# Patient Record
Sex: Female | Born: 1975 | Race: White | Hispanic: No | Marital: Married | Smoking: Former smoker
Health system: Southern US, Community
[De-identification: ages and names within clinical notes are randomized; demographics above are authoritative.]

## PROBLEM LIST (undated history)

## (undated) DIAGNOSIS — K219 Gastro-esophageal reflux disease without esophagitis: Secondary | ICD-10-CM

## (undated) DIAGNOSIS — J302 Other seasonal allergic rhinitis: Secondary | ICD-10-CM

## (undated) DIAGNOSIS — J45909 Unspecified asthma, uncomplicated: Secondary | ICD-10-CM

## (undated) HISTORY — DX: Unspecified asthma, uncomplicated: J45.909

## (undated) HISTORY — PX: DIAGNOSTIC LAPAROSCOPY: SUR761

## (undated) HISTORY — DX: Other seasonal allergic rhinitis: J30.2

---

## 2013-11-09 DIAGNOSIS — J45909 Unspecified asthma, uncomplicated: Secondary | ICD-10-CM | POA: Insufficient documentation

## 2013-11-09 DIAGNOSIS — E781 Pure hyperglyceridemia: Secondary | ICD-10-CM | POA: Insufficient documentation

## 2016-03-22 HISTORY — PX: OTHER SURGICAL HISTORY: SHX169

## 2016-09-06 ENCOUNTER — Ambulatory Visit (INDEPENDENT_AMBULATORY_CARE_PROVIDER_SITE_OTHER): Payer: BLUE CROSS/BLUE SHIELD | Admitting: Obstetrics & Gynecology

## 2016-09-06 ENCOUNTER — Encounter: Payer: Self-pay | Admitting: Obstetrics & Gynecology

## 2016-09-06 DIAGNOSIS — N920 Excessive and frequent menstruation with regular cycle: Secondary | ICD-10-CM

## 2016-09-06 NOTE — Progress Notes (Signed)
Subjective:     Patient ID: Anne Dixon, female   DOB: 04/22/1976, 40 y.o.   MRN: [id number]  HPI Pt presents for 2nd opinion.  Pt has had long Menstruation. Patient has a long-standing history of periods that last 10-14 days. She's never been able to be controlled on birth control pills. Patient had a E sure placed but had to be removed laparoscopically due to pelvic pain. 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 After the coils had been removed (salpingectomy) patient still experiences 10-14 day menstrual cycles. Patient was asked to take oral contraceptives for 2 more months prior to ablation. Patient took oral contraceptives for 1 month and did not have cycle control. [facility]. Patient never had endometrial biopsy. During the sure there was hysteroscopy and there was no polyp or fibroid noted. Of note, the patient used to do very well with Depo-Provera and had amenorrhea. Patient is not opposed to a Mirena IUD.  Review of Systems  Constitutional: Negative.   Cardiovascular: Negative.   Gastrointestinal: Negative.   Endocrine: Negative.   Genitourinary: Positive for menstrual problem.  Psychiatric/Behavioral: Negative.        Objective:   Physical Exam No exam performed. This was a consult for management.    Assessment:     40 year old female with long menstrual cycles that occur regularly.    Plan:   1-Endometrial biopsy suggested 2-Depo-Provera or Mirena as least invasive step 3-endometrial ablation if Depo-Provera or Mirena pale 4-hysterectomy as last resort 5-patient is going to think about her options. She may go back to [facility] or she may return here.  30 minutes spent face-to-face with patient with greater than 50% counseling

## 2016-09-19 ENCOUNTER — Ambulatory Visit (INDEPENDENT_AMBULATORY_CARE_PROVIDER_SITE_OTHER): Payer: BLUE CROSS/BLUE SHIELD | Admitting: Obstetrics & Gynecology

## 2016-09-19 ENCOUNTER — Encounter: Payer: Self-pay | Admitting: Obstetrics & Gynecology

## 2016-09-19 VITALS — BP 109/66 | HR 80 | Resp 16 | Ht 62.0 in | Wt 192.0 lb

## 2016-09-19 DIAGNOSIS — N939 Abnormal uterine and vaginal bleeding, unspecified: Secondary | ICD-10-CM

## 2016-09-19 DIAGNOSIS — Z3202 Encounter for pregnancy test, result negative: Secondary | ICD-10-CM

## 2016-09-19 LAB — POCT URINE PREGNANCY: Preg Test, Ur: NEGATIVE

## 2016-09-19 NOTE — Progress Notes (Signed)
ENDOMETRIAL BIOPSY     The indications for endometrial biopsy were reviewed.   Risks of the biopsy including cramping, bleeding, infection, uterine perforation, inadequate specimen and need for additional procedures  were discussed. The patient states she understands and agrees to undergo procedure today. Consent was signed. Time out was performed. Urine HCG was negative. A sterile speculum was placed in the patient's vagina and the cervix was prepped with Betadine. A single-toothed tenaculum was placed on the anterior lip of the cervix to stabilize it. The 3 mm pipelle was introduced into the endometrial cavity without difficulty to a depth of 7.5 cm, and a moderate amount of tissue was obtained and sent to pathology. The instruments were removed from the patient's vagina. Minimal bleeding from the cervix was noted. The patient tolerated the procedure well. Routine post-procedure instructions were given to the patient. The patient will follow up to review the results and for further management.    Pt wants a Mirena and will come back in for that in 1-3 weeks.

## 2016-09-20 ENCOUNTER — Encounter: Payer: Self-pay | Admitting: *Deleted

## 2016-09-27 ENCOUNTER — Encounter: Payer: Self-pay | Admitting: Obstetrics & Gynecology

## 2016-10-02 ENCOUNTER — Telehealth: Payer: Self-pay | Admitting: *Deleted

## 2016-10-02 NOTE — Telephone Encounter (Signed)
Pt notified that Dr Penne LashLeggett is waiting for additional information from pathology about her polyp before procedding with IUD insertion

## 2016-10-03 ENCOUNTER — Telehealth: Payer: Self-pay | Admitting: Obstetrics & Gynecology

## 2016-10-03 NOTE — Telephone Encounter (Signed)
Patient to discuss biopsy results. I did discuss the case with Dr. Sherald HessJoshua Cash pathologist. He said the polyps with miniscule and I most likely guided in the Pipelle. I do not have the patient's records from her prior doctor. She will bring them by November 2 2R [place] office. I would like to see how thick her endometrial lining is. We discussed options of seeing if bleeding improved after polyp removal. IUD. Hysteroscopy and ablation. An saline sono hysterogram. Once I review the ultrasound we will make a final decision together.

## 2016-10-22 ENCOUNTER — Telehealth: Payer: Self-pay

## 2016-10-22 NOTE — Telephone Encounter (Signed)
Left message for pt to call office to schedule appt. Pt left a VM on office answering machine to get scheduled.

## 2016-10-30 ENCOUNTER — Encounter: Payer: Self-pay | Admitting: Obstetrics & Gynecology

## 2016-11-15 ENCOUNTER — Encounter: Payer: Self-pay | Admitting: Obstetrics & Gynecology

## 2016-11-15 ENCOUNTER — Ambulatory Visit (INDEPENDENT_AMBULATORY_CARE_PROVIDER_SITE_OTHER): Payer: BLUE CROSS/BLUE SHIELD | Admitting: Obstetrics & Gynecology

## 2016-11-15 VITALS — BP 123/76 | HR 77 | Ht 63.0 in | Wt 193.0 lb

## 2016-11-15 DIAGNOSIS — Z0000 Encounter for general adult medical examination without abnormal findings: Secondary | ICD-10-CM | POA: Diagnosis not present

## 2016-11-15 DIAGNOSIS — Z01419 Encounter for gynecological examination (general) (routine) without abnormal findings: Secondary | ICD-10-CM

## 2016-11-15 NOTE — Progress Notes (Signed)
Subjective:     Anne GimenezShannon Dixon is a 40 y.o. female here for a routine exam.  Current complaints: none.  Last menses only lasted 7 days with nml flow.  Pt good with no intervention at this time.  If long menses returns, will consider IUD or ablation..   Gynecologic History Patient's last menstrual period was 11/08/2016. Contraception: salpingectomy Last Pap: 2016. Results were: normal Last mammogram: 2017. Results were: normal per patient  Obstetric History OB History  Gravida Para Term Preterm AB Living  2 2 2     2   SAB TAB Ectopic Multiple Live Births               # Outcome Date GA Lbr Len/2nd Weight Sex Delivery Anes PTL Lv  2 Term           1 Term                The following portions of the patient's history were reviewed and updated as appropriate: allergies, current medications, past family history, past medical history, past social history, past surgical history and problem list.  Review of Systems Pertinent items noted in HPI and remainder of comprehensive ROS otherwise negative.    Objective:      Vitals:   11/15/16 1320  BP: 123/76  Pulse: 77  Weight: 193 lb (87.5 kg)  Height: 5\' 3"  (1.6 m)   Vitals:  WNL General appearance: alert, cooperative and no distress  HEENT: Normocephalic, without obvious abnormality, atraumatic Eyes: negative Throat: lips, mucosa, and tongue normal; teeth and gums normal  Respiratory: Clear to auscultation bilaterally  CV: Regular rate and rhythm  Breasts:  Normal appearance, no masses or tenderness, no nipple retraction or dimpling  GI: Soft, non-tender; bowel sounds normal; no masses,  no organomegaly  GU: External Genitalia:  Tanner V, no lesion Urethra:  No prolapse   Vagina: Pink, normal rugae, no blood or discharge  Cervix: No CMT, no lesion  Uterus:  Normal size and contour, non tender  Adnexa: Normal, no masses, non tender  Musculoskeletal: No edema, redness or tenderness in the calves or thighs  Skin: No lesions or  rash  Lymphatic: Axillary adenopathy: none     Psychiatric: Normal mood and behavior   Vitals:   11/15/16 1320  BP: 123/76  Pulse: 77  Weight: 193 lb (87.5 kg)  Height: 5\' 3"  (1.6 m)      Assessment:    Healthy female exam.    Plan:    Education reviewed: self breast exams and weight bearing exercise. Contraception: salpingectomy. Follow up in: 1 year. Yearly mammogram  Pt needs pap in 2 years (had nml lat year with another practice)

## 2016-11-15 NOTE — Patient Instructions (Signed)
 Preventive Care 18-39 Years, Female Preventive care refers to lifestyle choices and visits with your health care provider that can promote health and wellness. What does preventive care include?  A yearly physical exam. This is also called an annual well check.  Dental exams once or twice a year.  Routine eye exams. Ask your health care provider how often you should have your eyes checked.  Personal lifestyle choices, including:  Daily care of your teeth and gums.  Regular physical activity.  Eating a healthy diet.  Avoiding tobacco and drug use.  Limiting alcohol use.  Practicing safe sex.  Taking vitamin and mineral supplements as recommended by your health care provider. What happens during an annual well check? The services and screenings done by your health care provider during your annual well check will depend on your age, overall health, lifestyle risk factors, and family history of disease. Counseling  Your health care provider may ask you questions about your:  Alcohol use.  Tobacco use.  Drug use.  Emotional well-being.  Home and relationship well-being.  Sexual activity.  Eating habits.  Work and work environment.  Method of birth control.  Menstrual cycle.  Pregnancy history. Screening  You may have the following tests or measurements:  Height, weight, and BMI.  Diabetes screening. This is done by checking your blood sugar (glucose) after you have not eaten for a while (fasting).  Blood pressure.  Lipid and cholesterol levels. These may be checked every 5 years starting at age 20.  Skin check.  Hepatitis C blood test.  Hepatitis B blood test.  Sexually transmitted disease (STD) testing.  BRCA-related cancer screening. This may be done if you have a family history of breast, ovarian, tubal, or peritoneal cancers.  Pelvic exam and Pap test. This may be done every 3 years starting at age 21. Starting at age 30, this may be done  every 5 years if you have a Pap test in combination with an HPV test. Discuss your test results, treatment options, and if necessary, the need for more tests with your health care provider. Vaccines  Your health care provider may recommend certain vaccines, such as:  Influenza vaccine. This is recommended every year.  Tetanus, diphtheria, and acellular pertussis (Tdap, Td) vaccine. You may need a Td booster every 10 years.  Varicella vaccine. You may need this if you have not been vaccinated.  HPV vaccine. If you are 26 or younger, you may need three doses over 6 months.  Measles, mumps, and rubella (MMR) vaccine. You may need at least one dose of MMR. You may also need a second dose.  Pneumococcal 13-valent conjugate (PCV13) vaccine. You may need this if you have certain conditions and were not previously vaccinated.  Pneumococcal polysaccharide (PPSV23) vaccine. You may need one or two doses if you smoke cigarettes or if you have certain conditions.  Meningococcal vaccine. One dose is recommended if you are age 19-21 years and a first-year college student living in a residence hall, or if you have one of several medical conditions. You may also need additional booster doses.  Hepatitis A vaccine. You may need this if you have certain conditions or if you travel or work in places where you may be exposed to hepatitis A.  Hepatitis B vaccine. You may need this if you have certain conditions or if you travel or work in places where you may be exposed to hepatitis B.  Haemophilus influenzae type b (Hib) vaccine. You may need   this if you have certain risk factors. Talk to your health care provider about which screenings and vaccines you need and how often you need them. This information is not intended to replace advice given to you by your health care provider. Make sure you discuss any questions you have with your health care provider. Document Released: 01/15/2002 Document Revised:  08/08/2016 Document Reviewed: 09/20/2015 Elsevier Interactive Patient Education  2017 Elsevier Inc.  

## 2016-11-27 ENCOUNTER — Other Ambulatory Visit: Payer: Self-pay | Admitting: *Deleted

## 2016-11-27 ENCOUNTER — Encounter: Payer: Self-pay | Admitting: Obstetrics & Gynecology

## 2016-11-27 ENCOUNTER — Telehealth: Payer: Self-pay | Admitting: *Deleted

## 2016-11-27 DIAGNOSIS — Z30430 Encounter for insertion of intrauterine contraceptive device: Secondary | ICD-10-CM

## 2016-11-27 MED ORDER — MISOPROSTOL 200 MCG PO TABS: ORAL_TABLET | ORAL | 0 refills | Status: DC

## 2016-11-27 NOTE — Telephone Encounter (Signed)
[facility] does not carry Cytotec so [facility] [place]

## 2016-11-27 NOTE — Telephone Encounter (Signed)
Pt called requesting an appt for IUD insert.  [facility] to [facility] into vagina night prior to procedure.

## 2016-12-04 ENCOUNTER — Encounter: Payer: Self-pay | Admitting: Obstetrics & Gynecology

## 2016-12-04 ENCOUNTER — Ambulatory Visit (INDEPENDENT_AMBULATORY_CARE_PROVIDER_SITE_OTHER): Payer: BLUE CROSS/BLUE SHIELD | Admitting: Obstetrics & Gynecology

## 2016-12-04 ENCOUNTER — Encounter: Payer: Self-pay | Admitting: *Deleted

## 2016-12-04 VITALS — BP 107/70 | HR 72 | Resp 16 | Ht 63.0 in | Wt 193.0 lb

## 2016-12-04 DIAGNOSIS — Z113 Encounter for screening for infections with a predominantly sexual mode of transmission: Secondary | ICD-10-CM | POA: Diagnosis not present

## 2016-12-04 DIAGNOSIS — Z3202 Encounter for pregnancy test, result negative: Secondary | ICD-10-CM

## 2016-12-04 DIAGNOSIS — Z30430 Encounter for insertion of intrauterine contraceptive device: Secondary | ICD-10-CM | POA: Diagnosis not present

## 2016-12-04 LAB — POCT URINE PREGNANCY: Preg Test, Ur: NEGATIVE

## 2016-12-04 MED ORDER — LEVONORGESTREL 20 MCG/24HR IU IUD
INTRAUTERINE_SYSTEM | Freq: Once | INTRAUTERINE | Status: AC
  Administered 2016-12-04: 15:00:00 via INTRAUTERINE

## 2016-12-05 NOTE — Progress Notes (Signed)
IUD Procedure Note Patient identified, informed consent performed.  Discussed risks of irregular bleeding, cramping, infection, malpositioning or misplacement of the IUD outside the uterus which may require further procedures. Time out was performed.  Urine pregnancy test negative.  Speculum placed in the vagina.  Cervix visualized.  Cleaned with Betadine x 2.  Grasped anteriorly with a single tooth tenaculum.  Uterus sounded to 7.5 cm.  Mirena IUD placed per manufacturer's recommendations.  Strings trimmed to 3 cm. Tenaculum was removed, good hemostasis noted.  Patient tolerated procedure well.   Patient was given post-procedure instructions and the Mirena care card with expiration date.  Patient was also asked to check IUD strings periodically and follow up in 4-6 weeks for IUD check.

## 2016-12-06 LAB — CERVICOVAGINAL ANCILLARY ONLY
Chlamydia: NEGATIVE
NEISSERIA GONORRHEA: NEGATIVE

## 2016-12-18 ENCOUNTER — Encounter: Payer: Self-pay | Admitting: Obstetrics & Gynecology

## 2017-01-09 ENCOUNTER — Encounter: Payer: Self-pay | Admitting: *Deleted

## 2017-01-09 ENCOUNTER — Encounter (HOSPITAL_COMMUNITY): Payer: Self-pay

## 2017-01-16 ENCOUNTER — Ambulatory Visit: Payer: BLUE CROSS/BLUE SHIELD | Admitting: Obstetrics & Gynecology

## 2017-01-17 ENCOUNTER — Encounter (HOSPITAL_COMMUNITY): Payer: Self-pay

## 2017-01-24 ENCOUNTER — Ambulatory Visit (HOSPITAL_COMMUNITY): Payer: BLUE CROSS/BLUE SHIELD | Admitting: Anesthesiology

## 2017-01-24 ENCOUNTER — Ambulatory Visit (HOSPITAL_COMMUNITY)
Admission: RE | Admit: 2017-01-24 | Discharge: 2017-01-24 | Disposition: A | Discharge status: Home / Self Care | Payer: BLUE CROSS/BLUE SHIELD | Source: Ambulatory Visit | Attending: Obstetrics & Gynecology | Admitting: Obstetrics & Gynecology

## 2017-01-24 ENCOUNTER — Encounter (HOSPITAL_COMMUNITY)
Admission: RE | Disposition: A | Discharge status: Home / Self Care | Payer: Self-pay | Source: Ambulatory Visit | Attending: Obstetrics & Gynecology

## 2017-01-24 ENCOUNTER — Encounter (HOSPITAL_COMMUNITY): Payer: Self-pay | Admitting: Anesthesiology

## 2017-01-24 DIAGNOSIS — J45909 Unspecified asthma, uncomplicated: Secondary | ICD-10-CM | POA: Diagnosis not present

## 2017-01-24 DIAGNOSIS — Z30432 Encounter for removal of intrauterine contraceptive device: Secondary | ICD-10-CM | POA: Diagnosis not present

## 2017-01-24 DIAGNOSIS — N921 Excessive and frequent menstruation with irregular cycle: Secondary | ICD-10-CM | POA: Insufficient documentation

## 2017-01-24 DIAGNOSIS — N840 Polyp of corpus uteri: Secondary | ICD-10-CM | POA: Insufficient documentation

## 2017-01-24 DIAGNOSIS — Z882 Allergy status to sulfonamides status: Secondary | ICD-10-CM | POA: Diagnosis not present

## 2017-01-24 HISTORY — DX: Gastro-esophageal reflux disease without esophagitis: K21.9

## 2017-01-24 HISTORY — PX: HYSTEROSCOPY: SHX211

## 2017-01-24 LAB — PREGNANCY, URINE: Preg Test, Ur: NEGATIVE

## 2017-01-24 LAB — CBC
HCT: 39.8 % (ref 36.0–46.0)
HEMOGLOBIN: 13.7 g/dL (ref 12.0–15.0)
MCH: 30.1 pg (ref 26.0–34.0)
MCHC: 34.4 g/dL (ref 30.0–36.0)
MCV: 87.5 fL (ref 78.0–100.0)
Platelets: 250 10*3/uL (ref 150–400)
RBC: 4.55 MIL/uL (ref 3.87–5.11)
RDW: 13.1 % (ref 11.5–15.5)
WBC: 10.5 10*3/uL (ref 4.0–10.5)

## 2017-01-24 LAB — TYPE AND SCREEN
ABO/RH(D): AB POS
Antibody Screen: NEGATIVE

## 2017-01-24 LAB — ABO/RH: ABO/RH(D): AB POS

## 2017-01-24 SURGERY — ABLATION, ENDOMETRIUM, HYSTEROSCOPIC
Anesthesia: General | Site: Abdomen

## 2017-01-24 MED ORDER — KETOROLAC TROMETHAMINE 30 MG/ML IJ SOLN
INTRAMUSCULAR | Status: AC
  Filled 2017-01-24: qty 1

## 2017-01-24 MED ORDER — PROPOFOL 10 MG/ML IV BOLUS
INTRAVENOUS | Status: AC
  Filled 2017-01-24: qty 20

## 2017-01-24 MED ORDER — SCOPOLAMINE 1 MG/3DAYS TD PT72
1.0000 | MEDICATED_PATCH | Freq: Once | TRANSDERMAL | Status: DC
  Administered 2017-01-24: 1.5 mg via TRANSDERMAL

## 2017-01-24 MED ORDER — FENTANYL CITRATE (PF) 100 MCG/2ML IJ SOLN
25.0000 ug | INTRAMUSCULAR | Status: DC | PRN
  Administered 2017-01-24: 50 ug via INTRAVENOUS
  Administered 2017-01-24 (×2): 25 ug via INTRAVENOUS

## 2017-01-24 MED ORDER — MIDAZOLAM HCL 2 MG/2ML IJ SOLN
INTRAMUSCULAR | Status: AC
  Filled 2017-01-24: qty 2

## 2017-01-24 MED ORDER — LACTATED RINGERS IV SOLN
INTRAVENOUS | Status: DC
  Administered 2017-01-24: 08:00:00 via INTRAVENOUS

## 2017-01-24 MED ORDER — KETOROLAC TROMETHAMINE 30 MG/ML IJ SOLN
INTRAMUSCULAR | Status: DC | PRN
  Administered 2017-01-24: 30 mg via INTRAVENOUS

## 2017-01-24 MED ORDER — FENTANYL CITRATE (PF) 100 MCG/2ML IJ SOLN
INTRAMUSCULAR | Status: DC | PRN
  Administered 2017-01-24 (×2): 50 ug via INTRAVENOUS

## 2017-01-24 MED ORDER — DEXAMETHASONE SODIUM PHOSPHATE 10 MG/ML IJ SOLN
INTRAMUSCULAR | Status: DC | PRN
  Administered 2017-01-24: 4 mg via INTRAVENOUS

## 2017-01-24 MED ORDER — METOCLOPRAMIDE HCL 5 MG/ML IJ SOLN: 10.0000 mg | Freq: Once | INTRAMUSCULAR | Status: DC | PRN

## 2017-01-24 MED ORDER — ONDANSETRON HCL 4 MG/2ML IJ SOLN
INTRAMUSCULAR | Status: DC | PRN
  Administered 2017-01-24: 4 mg via INTRAVENOUS

## 2017-01-24 MED ORDER — ONDANSETRON HCL 4 MG/2ML IJ SOLN
INTRAMUSCULAR | Status: AC
  Filled 2017-01-24: qty 2

## 2017-01-24 MED ORDER — MEPERIDINE HCL 25 MG/ML IJ SOLN: 6.2500 mg | INTRAMUSCULAR | Status: DC | PRN

## 2017-01-24 MED ORDER — SODIUM CHLORIDE 0.9 % IR SOLN
Status: DC | PRN
  Administered 2017-01-24: 3000 mL

## 2017-01-24 MED ORDER — HYDROCODONE-ACETAMINOPHEN 5-325 MG PO TABS: ORAL_TABLET | ORAL | 0 refills | Status: DC

## 2017-01-24 MED ORDER — LACTATED RINGERS IV SOLN
INTRAVENOUS | Status: DC | PRN
  Administered 2017-01-24: 08:00:00 via INTRAVENOUS

## 2017-01-24 MED ORDER — IBUPROFEN 600 MG PO TABS: 600.0000 mg | ORAL_TABLET | Freq: Four times a day (QID) | ORAL | 1 refills | Status: DC | PRN

## 2017-01-24 MED ORDER — PROPOFOL 10 MG/ML IV BOLUS
INTRAVENOUS | Status: DC | PRN
  Administered 2017-01-24: 180 mg via INTRAVENOUS

## 2017-01-24 MED ORDER — SODIUM CHLORIDE 0.9 % IV SOLN: INTRAVENOUS | Status: DC

## 2017-01-24 MED ORDER — LIDOCAINE HCL (CARDIAC) 20 MG/ML IV SOLN
INTRAVENOUS | Status: AC
  Filled 2017-01-24: qty 5

## 2017-01-24 MED ORDER — SCOPOLAMINE 1 MG/3DAYS TD PT72
MEDICATED_PATCH | TRANSDERMAL | Status: AC
  Administered 2017-01-24: 1.5 mg via TRANSDERMAL
  Filled 2017-01-24: qty 1

## 2017-01-24 MED ORDER — FENTANYL CITRATE (PF) 100 MCG/2ML IJ SOLN
INTRAMUSCULAR | Status: AC
  Administered 2017-01-24: 50 ug via INTRAVENOUS
  Filled 2017-01-24: qty 2

## 2017-01-24 MED ORDER — FENTANYL CITRATE (PF) 100 MCG/2ML IJ SOLN
INTRAMUSCULAR | Status: AC
  Filled 2017-01-24: qty 2

## 2017-01-24 MED ORDER — DEXAMETHASONE SODIUM PHOSPHATE 4 MG/ML IJ SOLN
INTRAMUSCULAR | Status: AC
  Filled 2017-01-24: qty 1

## 2017-01-24 MED ORDER — LIDOCAINE HCL (CARDIAC) 20 MG/ML IV SOLN
INTRAVENOUS | Status: DC | PRN
  Administered 2017-01-24: 70 mg via INTRAVENOUS
  Administered 2017-01-24: 30 mg via INTRAVENOUS

## 2017-01-24 MED ORDER — MIDAZOLAM HCL 2 MG/2ML IJ SOLN
INTRAMUSCULAR | Status: DC | PRN
  Administered 2017-01-24: 1 mg via INTRAVENOUS

## 2017-01-24 MED ORDER — LACTATED RINGERS IV SOLN: INTRAVENOUS | Status: DC

## 2017-01-24 SURGICAL SUPPLY — 14 items
CATH ROBINSON RED A/P 16FR (CATHETERS) ×3 IMPLANT
CLOTH BEACON ORANGE TIMEOUT ST (SAFETY) ×3 IMPLANT
CONTAINER PREFILL 10% NBF 60ML (FORM) ×6 IMPLANT
ELECT REM PT RETURN 9FT ADLT (ELECTROSURGICAL)
ELECTRODE REM PT RTRN 9FT ADLT (ELECTROSURGICAL) IMPLANT
GLOVE BIO SURGEON STRL SZ7 (GLOVE) ×3 IMPLANT
GLOVE BIOGEL PI IND STRL 7.0 (GLOVE) ×2 IMPLANT
GLOVE BIOGEL PI INDICATOR 7.0 (GLOVE) ×4
GOWN STRL REUS W/TWL LRG LVL3 (GOWN DISPOSABLE) ×9 IMPLANT
NS IRRIG 1000ML POUR BTL (IV SOLUTION) ×3 IMPLANT
PACK VAGINAL MINOR WOMEN LF (CUSTOM PROCEDURE TRAY) ×3 IMPLANT
PAD OB MATERNITY 4.3X12.25 (PERSONAL CARE ITEMS) ×6 IMPLANT
SET GENESYS HTA PROCERVA (MISCELLANEOUS) ×3 IMPLANT
TOWEL OR 17X24 6PK STRL BLUE (TOWEL DISPOSABLE) ×6 IMPLANT

## 2017-01-24 NOTE — Discharge Instructions (Signed)
General Anesthesia, Adult, Care After °These instructions provide you with information about caring for yourself after your procedure. Your health care provider may also give you more specific instructions. Your treatment has been planned according to current medical practices, but problems sometimes occur. Call your health care provider if you have any problems or questions after your procedure. °What can I expect after the procedure? °After the procedure, it is common to have: °· Vomiting. °· A sore throat. °· Mental slowness. °It is common to feel: °· Nauseous. °· Cold or shivery. °· Sleepy. °· Tired. °· Sore or achy, even in parts of your body where you did not have surgery. °Follow these instructions at home: °For at least 24 hours after the procedure: °· Do not: °¨ Participate in activities where you could fall or become injured. °¨ Drive. °¨ Use heavy machinery. °¨ Drink alcohol. °¨ Take sleeping pills or medicines that cause drowsiness. °¨ Make important decisions or sign legal documents. °¨ Take care of children on your own. °· Rest. °Eating and drinking °· If you vomit, drink water, juice, or soup when you can drink without vomiting. °· Drink enough fluid to keep your urine clear or pale yellow. °· Make sure you have little or no nausea before eating solid foods. °· Follow the diet recommended by your health care provider. °General instructions °· Have a responsible adult stay with you until you are awake and alert. °· Return to your normal activities as told by your health care provider. Ask your health care provider what activities are safe for you. °· Take over-the-counter and prescription medicines only as told by your health care provider. °· If you smoke, do not smoke without supervision. °· Keep all follow-up visits as told by your health care provider. This is important. °Contact a health care provider if: °· You continue to have nausea or vomiting at home, and medicines are not helpful. °· You  cannot drink fluids or start eating again. °· You cannot urinate after 8-12 hours. °· You develop a skin rash. °· You have fever. °· You have increasing redness at the site of your procedure. °Get help right away if: °· You have difficulty breathing. °· You have chest pain. °· You have unexpected bleeding. °· You feel that you are having a life-threatening or urgent problem. °This information is not intended to replace advice given to you by your health care provider. Make sure you discuss any questions you have with your health care provider. °Document Released: 02/25/2001 Document Revised: 04/23/2016 Document Reviewed: 11/03/2015 °Elsevier Interactive Patient Education © 2017 Elsevier Inc. °Dilation and Curettage or Vacuum Curettage, Care After °These instructions give you information about caring for yourself after your procedure. Your doctor may also give you more specific instructions. Call your doctor if you have any problems or questions after your procedure. °Follow these instructions at home: °Activity °· Do not drive or use heavy machinery while taking prescription pain medicine. °· For 24 hours after your procedure, avoid driving. °· Take short walks often, followed by rest periods. Ask your doctor what activities are safe for you. After one or two days, you may be able to return to your normal activities. °· Do not lift anything that is heavier than 10 lb (4.5 kg) until your doctor approves. °· For at least 2 weeks, or as long as told by your doctor: °¨ Do not douche. °¨ Do not use tampons. °¨ Do not have sex. °General instructions °· Take over-the-counter and prescription medicines   only as told by your doctor. This is very important if you take blood thinning medicine. °· Do not take baths, swim, or use a hot tub until your doctor approves. Take showers instead of baths. °· Wear compression stockings as told by your doctor. °· It is up to you to get the results of your procedure. Ask your doctor when  your results will be ready. °· Keep all follow-up visits as told by your doctor. This is important. °Contact a doctor if: °· You have very bad cramps that get worse or do not get better with medicine. °· You have very bad pain in your belly (abdomen). °· You cannot drink fluids without throwing up (vomiting). °· You get pain in a different part of the area between your belly and thighs (pelvis). °· You have bad-smelling discharge from your vagina. °· You have a rash. °Get help right away if: °· You are bleeding a lot from your vagina. A lot of bleeding means soaking more than one sanitary pad in an hour, for 2 hours in a row. °· You have clumps of blood (blood clots) coming from your vagina. °· You have a fever or chills. °· Your belly feels very tender or hard. °· You have chest pain. °· You have trouble breathing. °· You cough up blood. °· You feel dizzy. °· You feel light-headed. °· You pass out (faint). °· You have pain in your neck or shoulder area. °Summary °· Take short walks often, followed by rest periods. Ask your doctor what activities are safe for you. After one or two days, you may be able to return to your normal activities. °· Do not lift anything that is heavier than 10 lb (4.5 kg) until your doctor approves. °· Do not take baths, swim, or use a hot tub until your doctor approves. Take showers instead of baths. °· Contact your doctor if you have any symptoms of infection, like bad-smelling discharge from your vagina. °This information is not intended to replace advice given to you by your health care provider. Make sure you discuss any questions you have with your health care provider. °Document Released: 08/28/2008 Document Revised: 08/06/2016 Document Reviewed: 08/06/2016 °Elsevier Interactive Patient Education © 2017 Elsevier Inc. ° °

## 2017-01-24 NOTE — Transfer of Care (Signed)
Immediate Anesthesia Transfer of Care Note  Patient: Anne GimenezShannon Folson  Procedure(s) Performed: Procedure(s): DILATATION AND CURRETAGE, HYDROTHERMAL ABLATION REMOVAL OF IUD (N/A)  Patient Location: PACU  Anesthesia Type:General  Level of Consciousness: awake, alert , oriented and patient cooperative  Airway & Oxygen Therapy: Patient Spontanous Breathing and Patient connected to nasal cannula oxygen  Post-op Assessment: Report given to RN and Post -op Vital signs reviewed and stable  Post vital signs: Reviewed and stable  Last Vitals:  Vitals:   01/24/17 0742  BP: 127/87  Pulse: 89  Resp: 16  Temp: 36.8 C    Last Pain:  Vitals:   01/24/17 0742  TempSrc: Oral      Patients Stated Pain Goal: 3 (01/24/17 0742)  Complications: No apparent anesthesia complications

## 2017-01-24 NOTE — Anesthesia Preprocedure Evaluation (Signed)
Anesthesia Evaluation  Patient identified by MRN, date of birth, ID band Patient awake    Reviewed: Allergy & Precautions, NPO status , Patient's Chart, lab work & pertinent test results  Airway Mallampati: II  TM Distance: >3 FB Neck ROM: Full    Dental no notable dental hx.    Pulmonary asthma ,    Pulmonary exam normal breath sounds clear to auscultation       Cardiovascular negative cardio ROS Normal cardiovascular exam Rhythm:Regular Rate:Normal     Neuro/Psych negative neurological ROS  negative psych ROS   GI/Hepatic negative GI ROS, Neg liver ROS, neg GERD  ,  Endo/Other  negative endocrine ROS  Renal/GU negative Renal ROS  negative genitourinary   Musculoskeletal negative musculoskeletal ROS (+)   Abdominal   Peds negative pediatric ROS (+)  Hematology negative hematology ROS (+)   Anesthesia Other Findings   Reproductive/Obstetrics negative OB ROS                             Anesthesia Physical Anesthesia Plan  ASA: II  Anesthesia Plan: General   Post-op Pain Management:    Induction: Intravenous  Airway Management Planned: LMA  Additional Equipment:   Intra-op Plan:   Post-operative Plan:   Informed Consent: I have reviewed the patients History and Physical, chart, labs and discussed the procedure including the risks, benefits and alternatives for the proposed anesthesia with the patient or authorized representative who has indicated his/her understanding and acceptance.   Dental advisory given  Plan Discussed with: CRNA  Anesthesia Plan Comments:         Anesthesia Quick Evaluation

## 2017-01-24 NOTE — Anesthesia Procedure Notes (Signed)
Procedure Name: Intubation Date/Time: 01/24/2017 8:49 AM Performed by: Suella GroveMOORE, Zvi Duplantis C Pre-anesthesia Checklist: Patient identified, Emergency Drugs available, Suction available and Patient being monitored Patient Re-evaluated:Patient Re-evaluated prior to inductionOxygen Delivery Method: Circle system utilized Preoxygenation: Pre-oxygenation with 100% oxygen Intubation Type: IV induction and Inhalational induction Ventilation: Mask ventilation without difficulty LMA: LMA inserted LMA Size: 4.0 Grade View: Grade II Number of attempts: 1 Placement Confirmation: positive ETCO2 and breath sounds checked- equal and bilateral Dental Injury: Teeth and Oropharynx as per pre-operative assessment

## 2017-01-24 NOTE — H&P (Signed)
Anne Dixon is an 41 y.o. female with menometrorrhagia that failed IUD.  Pertinent Gynecological History: Menses: irruegular and heavy Bleeding: intermenstrual bleeding Contraception: tubal ligation DES exposure: denies   Menstrual History:  Patient's last menstrual period was 01/24/2017.    Past Medical History:  Diagnosis Date  . Asthma   . GERD (gastroesophageal reflux disease)    with pregnancy  . Seasonal allergies     Past Surgical History:  Procedure Laterality Date  . CESAREAN SECTION    . DIAGNOSTIC LAPAROSCOPY    . Fallopian Tube Removal  03/22/2016    Family History  Problem Relation Age of Onset  . Diabetes Paternal Grandfather   . Diabetes Paternal Grandmother   . Diabetes Maternal Grandmother   . Diabetes Maternal Grandfather   . Diabetes Father   . Cervical cancer Sister     Social History:  reports that she has never smoked. She has never used smokeless tobacco. She reports that she drinks alcohol. She reports that she uses drugs, including Marijuana.  Allergies:  Allergies  Allergen Reactions  . Sulfa Antibiotics Rash    Maculopapular eruption on arms and upper chest 04/25/2015    Prescriptions Prior to Admission  Medication Sig Dispense Refill Last Dose  . cetirizine (ZYRTEC) 10 MG tablet Take 10 mg by mouth at bedtime.    01/23/2017 at Unknown time  . ibuprofen (ADVIL,MOTRIN) 200 MG tablet Take 400-600 mg by mouth every 6 (six) hours as needed for headache or moderate pain.   01/23/2017 at Unknown time  . montelukast (SINGULAIR) 10 MG tablet Take 10 mg by mouth at bedtime.    01/23/2017 at Unknown time  . Multiple Vitamin (MULTIVITAMIN WITH MINERALS) TABS tablet Take 0.5 tablets by mouth daily.   01/23/2017 at Unknown time  . pseudoephedrine (SUDAFED) 30 MG tablet Take 30 mg by mouth every 4 (four) hours as needed for congestion.   More than a month at Unknown time  . valACYclovir (VALTREX) 1000 MG tablet Take 1,000 mg by mouth 2 (two) times daily  as needed (fever blisters).    More than a month at Unknown time    Review of Systems  Constitutional: Negative.   Respiratory: Negative.   Cardiovascular: Negative.   Gastrointestinal: Negative.   Skin: Negative.   Neurological: Negative.   Psychiatric/Behavioral: Negative.     Blood pressure 127/87, pulse 89, temperature 98.3 F (36.8 C), temperature source Oral, resp. rate 16, height 5\' 2"  (1.575 m), weight 189 lb (85.7 kg), last menstrual period 01/24/2017, SpO2 100 %. Physical Exam  Vitals reviewed. Constitutional: She is oriented to person, place, and time. She appears well-developed and well-nourished. No distress.  HENT:  Head: Normocephalic and atraumatic.  Eyes: Conjunctivae are normal.  Neck: Neck supple. No thyromegaly present.  Cardiovascular: Normal rate and regular rhythm.   Respiratory: Effort normal and breath sounds normal.  GI: Soft. There is no tenderness. There is no rebound and no guarding.  Genitourinary:  Genitourinary Comments: Strings felt on exam  Musculoskeletal: Normal range of motion. She exhibits no tenderness.  Neurological: She is alert and oriented to person, place, and time.  Skin: Skin is warm and dry.  Psychiatric: She has a normal mood and affect.    Results for orders placed or performed during the hospital encounter of 01/24/17 (from the past 24 hour(s))  Pregnancy, urine     Status: None   Collection Time: 01/24/17  7:46 AM  Result Value Ref Range   Preg Test, Ur NEGATIVE  NEGATIVE  CBC     Status: None   Collection Time: 01/24/17  7:54 AM  Result Value Ref Range   WBC 10.5 4.0 - 10.5 K/uL   RBC 4.55 3.87 - 5.11 MIL/uL   Hemoglobin 13.7 12.0 - 15.0 g/dL   HCT 86.539.8 78.436.0 - 69.646.0 %   MCV 87.5 78.0 - 100.0 fL   MCH 30.1 26.0 - 34.0 pg   MCHC 34.4 30.0 - 36.0 g/dL   RDW 29.513.1 28.411.5 - 13.215.5 %   Platelets 250 150 - 400 K/uL   Assessment/Plan: 41 yo female with menometrorrhagia unresponsive to IUD.  Pt requests endometrial  ablation.  Pt consented for D & C, hysteroscopy, IUD removal, and hydrothermal endometrial ablation.  Risks include but not limited to bleeding, infection, damage to uterus, burn in the vagina.  SCDs placed for DVT prophylaxis.  All questions answered.   Elsie LincolnKelly Raymone Pembroke 01/24/2017, 8:37 AM

## 2017-01-24 NOTE — Op Note (Addendum)
PREOPERATIVE DIAGNOSIS:  41 yo female with menometrorrhagia and failed IUD therapy  POSTOPERATIVE DIAGNOSIS: The same  PROCEDURE:  D & C, Hysteroscopy, Hydrothermal Endometrial Ablation, IUD removal  SURGEON:  Dr. Elsie LincolnKelly Hennie Gosa  INDICATIONS: 41 y.o. yo G2P2002  here for menometrorrhagia   .Risks of surgery were discussed with the patient including but not limited to: bleeding which may require transfusion; infection which may require antibiotics; injury to uterus leading to risk of injury to surrounding intraperitoneal organs, need for additional procedures including laparoscopy or laparotomy, and other postoperative/anesthesia complications. Written informed consent was obtained.   FINDINGS:  A  Normal sized uterus.  Diffuse proliferative endometrium.  IUD in place at start of procedure  ANESTHESIA:   General  ESTIMATED BLOOD LOSS:  Less than 20 ml.  SPECIMENS: None  COMPLICATIONS:  None immediate.  PROCEDURE DETAILS:  The patient was taken to the operating room where general anesthesia was administered and was found to be adequate.  After an adequate timeout was performed, she was placed in the dorsal lithotomy position and examined; then prepped and draped in the sterile manner.   Her bladder was catheterized for an unmeasured amount of clear, yellow urine. A speculum was then placed in the patient's vagina and a single tooth tenaculum was applied to the anterior lip of the cervix.  The IUD strings were visible and grasped with a ring forceps.  The IUD was easily removed.  It was intact and discarded.  The cervix was dilated manually with half-sized Hegar dilators to accommodate the 8 mm hysteroscope.  Once the cervix was dilated, the hysteroscope was inserted under direct visualization. The uterine cavity was carefully examined, both ostia were recognized, and diffusely proliferative endometrium with polypoid fragments was noted.  A cervical seal test was conducted and there was no fluid  loss.  The Hydrothermal ablation was conducted with a 10 minute ablation at 80 degrees centigrade.  There was a 1 minute 30 second cool down period.  The hysteroscope was removed and a gentle curretage was performed. The tenaculum was removed from the anterior lip of the cervix, and the vaginal speculum was removed after noting good hemostasis.  The patient tolerated the procedure well and was taken to the recovery area awake, extubated and in stable condition.  The patient will be discharged to home as per PACU criteria.  Routine postoperative instructions given.  She was prescribed Percocet and Ibuprofen.  She will follow up in my office in 4 weeks for postoperative evaluation .

## 2017-01-25 ENCOUNTER — Encounter (HOSPITAL_COMMUNITY): Payer: Self-pay | Admitting: Obstetrics & Gynecology

## 2017-01-25 NOTE — Anesthesia Postprocedure Evaluation (Signed)
Anesthesia Post Note  Patient: Melida GimenezShannon Music  Procedure(s) Performed: Procedure(s) (LRB): DILATATION AND CURRETAGE, HYDROTHERMAL ABLATION REMOVAL OF IUD (N/A)  Patient location during evaluation: PACU Anesthesia Type: General Level of consciousness: awake and alert Pain management: pain level controlled Vital Signs Assessment: post-procedure vital signs reviewed and stable Respiratory status: spontaneous breathing, nonlabored ventilation, respiratory function stable and patient connected to nasal cannula oxygen Cardiovascular status: blood pressure returned to baseline and stable Postop Assessment: no signs of nausea or vomiting Anesthetic complications: no       Last Vitals:  Vitals:   01/24/17 1100 01/24/17 1125  BP: 135/84 128/80  Pulse: 86 84  Resp: 20 18  Temp:      Last Pain:  Vitals:   01/24/17 1125  TempSrc:   PainSc: 3                  Phillips Groutarignan, Avagrace Botelho

## 2017-02-21 ENCOUNTER — Encounter: Payer: Self-pay | Admitting: Obstetrics & Gynecology

## 2017-02-21 ENCOUNTER — Ambulatory Visit (INDEPENDENT_AMBULATORY_CARE_PROVIDER_SITE_OTHER): Payer: BLUE CROSS/BLUE SHIELD | Admitting: Obstetrics & Gynecology

## 2017-02-21 VITALS — BP 117/77 | HR 78 | Ht 64.0 in | Wt 189.0 lb

## 2017-02-21 DIAGNOSIS — N921 Excessive and frequent menstruation with irregular cycle: Secondary | ICD-10-CM

## 2017-02-21 NOTE — Progress Notes (Signed)
   Subjective:    Patient ID: Anne GimenezShannon Deasis, female    DOB: 08/30/1976, 41 y.o.   MRN: [id number]  HPI  Pt presents 4 weeks after endometrial ablation.  Pt had spotting and discharge for 3+ weeks.  No pain or fever.  Pt has not had bleeding for 2-3 days.    Review of Systems  Constitutional: Negative.   Respiratory: Negative.   Cardiovascular: Negative.   Gastrointestinal: Negative.   Genitourinary: Negative.        Objective:   Physical Exam  Constitutional: She is oriented to person, place, and time. She appears well-developed and well-nourished. No distress.  HENT:  Head: Normocephalic and atraumatic.  Eyes: Conjunctivae are normal.  Pulmonary/Chest: Effort normal.  Abdominal: Soft. She exhibits no distension and no mass. There is no tenderness. There is no rebound and no guarding.  Genitourinary:  Genitourinary Comments: Ext Gen:  Tanner V Vagina:  Pink nml rugae Cervix:  No lesion, no bleedgin Uterus:  Mobile, nontender   Musculoskeletal: She exhibits no edema.  Neurological: She is alert and oriented to person, place, and time.  Skin: Skin is warm and dry.  Psychiatric: She has a normal mood and affect.  Vitals reviewed.  Vitals:   02/21/17 1331  BP: 117/77  Pulse: 78  Weight: 189 lb (85.7 kg)  Height: 5\' 4"  (1.626 m)   Assessment & Plan:  41 yo female d/p HTA for menomet. 1-Doing much bteter. 2-Resume sexual intercourse 3-RTC prn and routine health maintenance.

## 2017-09-27 ENCOUNTER — Other Ambulatory Visit: Payer: Self-pay | Admitting: Obstetrics & Gynecology

## 2017-09-27 DIAGNOSIS — Z1231 Encounter for screening mammogram for malignant neoplasm of breast: Secondary | ICD-10-CM

## 2017-10-30 ENCOUNTER — Ambulatory Visit (INDEPENDENT_AMBULATORY_CARE_PROVIDER_SITE_OTHER): Payer: BLUE CROSS/BLUE SHIELD

## 2017-10-30 DIAGNOSIS — Z1231 Encounter for screening mammogram for malignant neoplasm of breast: Secondary | ICD-10-CM | POA: Diagnosis not present

## 2018-07-01 ENCOUNTER — Encounter: Payer: Self-pay | Admitting: Obstetrics & Gynecology

## 2018-07-17 ENCOUNTER — Encounter: Payer: Self-pay | Admitting: Obstetrics & Gynecology

## 2018-07-17 ENCOUNTER — Ambulatory Visit: Payer: BLUE CROSS/BLUE SHIELD | Admitting: Obstetrics & Gynecology

## 2018-07-17 VITALS — BP 120/78 | HR 78 | Wt 192.0 lb

## 2018-07-17 DIAGNOSIS — N939 Abnormal uterine and vaginal bleeding, unspecified: Secondary | ICD-10-CM

## 2018-07-17 DIAGNOSIS — Z3202 Encounter for pregnancy test, result negative: Secondary | ICD-10-CM

## 2018-07-17 DIAGNOSIS — N921 Excessive and frequent menstruation with irregular cycle: Secondary | ICD-10-CM

## 2018-07-17 LAB — POCT URINE PREGNANCY: PREG TEST UR: NEGATIVE

## 2018-07-17 NOTE — Progress Notes (Signed)
Pt states she is spotting every month and sometimes multiple times a month

## 2018-07-17 NOTE — Progress Notes (Signed)
   Subjective:    Patient ID: Anne GimenezShannon Aspinall, female    DOB: 12/13/1975, 42 y.o.   MRN: [id number]  HPI  42 yo female present for spotting.  Pt had endometrial ablation last year with good results for several months April-nml  May - July spotted several times in the month.  Sometimes it is heavy and sometimes it is spotting.   Pt does not want to go on hormonal treatment for bleeding.  She would like hysterectomy.   Review of Systems  Constitutional: Negative.   Respiratory: Negative.   Cardiovascular: Negative.   Gastrointestinal: Negative.   Genitourinary: Positive for menstrual problem and pelvic pain.  Psychiatric/Behavioral: Negative.        Objective:   Physical Exam  Constitutional: She is oriented to person, place, and time. She appears well-developed and well-nourished. No distress.  HENT:  Head: Normocephalic and atraumatic.  Eyes: Conjunctivae are normal.  Cardiovascular: Normal rate.  Pulmonary/Chest: Effort normal.  Abdominal: Soft. Bowel sounds are normal. She exhibits no distension and no mass. There is no tenderness. There is no rebound and no guarding.  Genitourinary: Vagina normal.  Musculoskeletal: She exhibits no edema.  Neurological: She is alert and oriented to person, place, and time.  Skin: Skin is warm and dry.  Psychiatric: She has a normal mood and affect.  Vitals reviewed.  Vitals:   07/17/18 1353  BP: 120/78  Pulse: 78  Weight: 192 lb (87.1 kg)    Assessment & Plan:  42 yo female with menometrrohagia for several months after ablation in 2018.  1.UPT neg 2.endometrial biopsy 3.Reviewed TVUS images from Dr. Delorise ShinerMims office--small uterus with length of 6 cm (under media--[place]) 4.Pt would like Hysterectomy; referred to Dr. Marice Potterove for consultation.  25 minutes spent face to face with patient with >50% counseling and coordination of care.  ENDOMETRIAL BIOPSY     The indications for endometrial biopsy were reviewed.   Risks of the biopsy  including cramping, bleeding, infection, uterine perforation, inadequate specimen and need for additional procedures  were discussed. The patient states she understands and agrees to undergo procedure today. Consent was signed. Time out was performed. Urine HCG was negative. A sterile speculum was placed in the patient's vagina and the cervix was prepped with Betadine. A single-toothed tenaculum was placed on the anterior lip of the cervix to stabilize it. The 3 mm pipelle was introduced into the endometrial cavity without difficulty to a depth of 5.5 cm, and a small amount of tissue was obtained and sent to pathology. The instruments were removed from the patient's vagina. Minimal bleeding from the cervix was noted. The patient tolerated the procedure well. Routine post-procedure instructions were given to the patient. The patient will follow up to review the results and for further management.

## 2018-07-18 DIAGNOSIS — N921 Excessive and frequent menstruation with irregular cycle: Secondary | ICD-10-CM | POA: Insufficient documentation

## 2018-08-06 ENCOUNTER — Ambulatory Visit: Payer: BLUE CROSS/BLUE SHIELD | Admitting: Obstetrics & Gynecology

## 2018-08-06 ENCOUNTER — Encounter: Payer: Self-pay | Admitting: Obstetrics & Gynecology

## 2018-08-06 VITALS — BP 110/76 | HR 74 | Resp 16 | Ht 64.0 in | Wt 192.0 lb

## 2018-08-06 DIAGNOSIS — Z124 Encounter for screening for malignant neoplasm of cervix: Secondary | ICD-10-CM | POA: Diagnosis not present

## 2018-08-06 DIAGNOSIS — Z1151 Encounter for screening for human papillomavirus (HPV): Secondary | ICD-10-CM | POA: Diagnosis not present

## 2018-08-06 DIAGNOSIS — Z0000 Encounter for general adult medical examination without abnormal findings: Secondary | ICD-10-CM

## 2018-08-06 DIAGNOSIS — N938 Other specified abnormal uterine and vaginal bleeding: Secondary | ICD-10-CM

## 2018-08-06 LAB — CBC
HEMATOCRIT: 41.6 % (ref 35.0–45.0)
HEMOGLOBIN: 13.8 g/dL (ref 11.7–15.5)
MCH: 30.4 pg (ref 27.0–33.0)
MCHC: 33.2 g/dL (ref 32.0–36.0)
MCV: 91.6 fL (ref 80.0–100.0)
MPV: 9.9 fL (ref 7.5–12.5)
Platelets: 285 10*3/uL (ref 140–400)
RBC: 4.54 10*6/uL (ref 3.80–5.10)
RDW: 12 % (ref 11.0–15.0)
WBC: 8.4 10*3/uL (ref 3.8–10.8)

## 2018-08-06 LAB — TSH: TSH: 2.3 m[IU]/L

## 2018-08-06 NOTE — Progress Notes (Signed)
   Subjective:    Patient ID: Anne Dixon, female    DOB: 1976/11/15, 42 y.o.   MRN: [id number]  HPI 42 yo married P2 (8 and 4 yo kids- 1 cesarean section) here today a hysterectomy. She has a long h/o menometrorrhagia. She had an Essure placed at [facility]. She was told that there were not "seated right" and she had both tubes removed via laparoscopy in about 2015 or 2016.  She then tried a d&c and HTA on 01/24/17. She declined an IUD. She used OCPs for many years and her periods were still heavy. She also tried a Mirena IUD in 2018 but still had too much bleeding. Her hbg was 13.7 in 2018 and she reports that she gets it annually at work ([employer]) and is always good, as is her TSH there.  She tracks her bleeding on her phone and has bleeding quite frequently. It is not heavy generally, but is very annoying.  Review of Systems She has only rare GSUI    Objective:   Physical Exam Breathing, conversing, and ambulating normally Well nourished, well hydrated White female, no apparent distress abd- benign C/s scar healed well 3rd degree uterine prolapse Wide pubic arch Bimanual exam reveals a normal size and shape, anteverted, mobile, non-tender, normal adnexal exam      Assessment & Plan:  Menometrorrhagia- check cbc and TSh, and gyn u/s I would rec a LAVH with LUQ 5 mm port site if she is certain that she wants a hysterectomy.

## 2018-08-07 LAB — CYTOLOGY - PAP
Diagnosis: NEGATIVE
HPV: NOT DETECTED

## 2018-08-08 ENCOUNTER — Ambulatory Visit (INDEPENDENT_AMBULATORY_CARE_PROVIDER_SITE_OTHER): Payer: BLUE CROSS/BLUE SHIELD

## 2018-08-08 DIAGNOSIS — N938 Other specified abnormal uterine and vaginal bleeding: Secondary | ICD-10-CM

## 2018-08-18 ENCOUNTER — Encounter: Payer: Self-pay | Admitting: Obstetrics & Gynecology

## 2018-08-18 ENCOUNTER — Ambulatory Visit: Payer: BLUE CROSS/BLUE SHIELD | Admitting: Obstetrics & Gynecology

## 2018-08-18 VITALS — BP 119/75 | HR 72 | Ht 64.0 in | Wt 193.0 lb

## 2018-08-18 DIAGNOSIS — N938 Other specified abnormal uterine and vaginal bleeding: Secondary | ICD-10-CM

## 2018-08-18 NOTE — Progress Notes (Signed)
   Subjective:    Patient ID: Anne GimenezShannon Dixon, female    DOB: 03/30/1976, 42 y.o.   MRN: [id number]  HPI 42 yo married P2 here to schedule a LAVH. She has DUB and has tried ablation, IUD, OCPs, all to no avail. She is aware that there are risks of surgery.   Review of Systems     Objective:   Physical Exam Breathing, conversing, and ambulating normally Well nourished, well hydrated White female, no apparent distress      Assessment & Plan:  [place] send [place] to schedule this.

## 2018-08-19 ENCOUNTER — Encounter (HOSPITAL_COMMUNITY): Payer: Self-pay

## 2018-08-21 ENCOUNTER — Encounter (HOSPITAL_COMMUNITY): Payer: Self-pay

## 2018-09-02 ENCOUNTER — Encounter (HOSPITAL_COMMUNITY): Payer: Self-pay

## 2018-09-10 ENCOUNTER — Encounter (HOSPITAL_COMMUNITY): Payer: BLUE CROSS/BLUE SHIELD

## 2018-09-19 NOTE — Patient Instructions (Addendum)
Your procedure is scheduled on:  Tuesday, 10/29 at 7:30 am  Enter through the Main Entrance of [facility] at: 6 am  Pick up the phone at the desk and dial [phone].  Call this number if you have problems the morning of surgery: [phone].  Remember: Do NOT eat food or Do NOT drink clear liquids (including water) after midnight Monday  Take these medicines the morning of surgery with a SIP OF WATER:  Valtrex if needed.  Ok to use symbicort inhaler  Brush your teeth on the day of surgery.  Bring Albuterol inhaler with you on day of surgery.  Do Not smoke on the day of surgery.  Stop herbal medications, vitamin supplements, Ibuprofen/NSAIDS at this time.  Do NOT wear jewelry (body piercing), metal hair clips/bobby pins, make-up, or nail polish. Do NOT wear lotions, powders, or perfumes.  You may wear deoderant. Do NOT shave for 48 hours prior to surgery. Do NOT bring valuables to the hospital. Contacts, dentures, or bridgework may not be worn into surgery.  Leave suitcase in car.  After surgery it may be brought to your room.  For patients admitted to the hospital, checkout time is 11:00 AM the day of discharge.

## 2018-09-24 ENCOUNTER — Inpatient Hospital Stay (HOSPITAL_COMMUNITY)
Admission: RE | Admit: 2018-09-24 | Discharge: 2018-09-24 | Disposition: A | Discharge status: Home / Self Care | Payer: BLUE CROSS/BLUE SHIELD | Source: Ambulatory Visit

## 2018-09-24 ENCOUNTER — Encounter (HOSPITAL_COMMUNITY): Payer: Self-pay

## 2018-09-24 ENCOUNTER — Encounter (HOSPITAL_COMMUNITY)
Admission: RE | Admit: 2018-09-24 | Discharge: 2018-09-24 | Disposition: A | Discharge status: Home / Self Care | Payer: BLUE CROSS/BLUE SHIELD | Source: Ambulatory Visit | Attending: Obstetrics & Gynecology | Admitting: Obstetrics & Gynecology

## 2018-09-24 ENCOUNTER — Other Ambulatory Visit: Payer: Self-pay

## 2018-09-24 DIAGNOSIS — Z01812 Encounter for preprocedural laboratory examination: Secondary | ICD-10-CM | POA: Diagnosis present

## 2018-09-24 LAB — CBC
HEMATOCRIT: 42.2 % (ref 36.0–46.0)
HEMOGLOBIN: 14 g/dL (ref 12.0–15.0)
MCH: 30.5 pg (ref 26.0–34.0)
MCHC: 33.2 g/dL (ref 30.0–36.0)
MCV: 91.9 fL (ref 80.0–100.0)
NRBC: 0 % (ref 0.0–0.2)
Platelets: 224 10*3/uL (ref 150–400)
RBC: 4.59 MIL/uL (ref 3.87–5.11)
RDW: 12.9 % (ref 11.5–15.5)
WBC: 7.6 10*3/uL (ref 4.0–10.5)

## 2018-09-24 LAB — TYPE AND SCREEN
ABO/RH(D): AB POS
Antibody Screen: NEGATIVE

## 2018-09-24 NOTE — Patient Instructions (Signed)
Your procedure is scheduled on: Tuesday September 30, 2018 at 7:30 am  Enter through the Main Entrance of [facility] at: 6:00 am  Pick up the phone at the desk and dial [phone].  Call this number if you have problems the morning of surgery: [phone].  Remember: Do NOT eat food or drink any liquids after: Midnight on Monday October 28  Take these medicines the morning of surgery with a SIP OF WATER: Symbicort, albuterol inhaler, valtrex if needed  STOP ALL VITAMINS, SUPPLEMENTS, HERBAL MEDICATIONS, NSAIDS NOW  DO NOT SMOKE DAY OF SURGERY  BRUSH YOUR TEETH DAY OF SURGERY  BRING ALBUTEROL INHALER WITH YOU DAY OF SURGERY  Do NOT wear jewelry (body piercing), metal hair clips/bobby pins, make-up, or nail polish. Do NOT wear lotions, powders, or perfumes.  You may wear deoderant. Do NOT shave for 48 hours prior to surgery. Do NOT bring valuables to the hospital. Contacts, dentures, or bridgework may not be worn into surgery. Leave suitcase in car.  After surgery it may be brought to your room.    For patients admitted to the hospital, checkout time is 11:00 AM the day of discharge.

## 2018-09-30 ENCOUNTER — Encounter (HOSPITAL_COMMUNITY): Payer: Self-pay

## 2018-09-30 ENCOUNTER — Encounter (HOSPITAL_COMMUNITY)
Admission: RE | Disposition: A | Discharge status: Home / Self Care | Payer: Self-pay | Source: Ambulatory Visit | Attending: Obstetrics & Gynecology

## 2018-09-30 ENCOUNTER — Other Ambulatory Visit: Payer: Self-pay

## 2018-09-30 ENCOUNTER — Inpatient Hospital Stay (HOSPITAL_COMMUNITY): Payer: BLUE CROSS/BLUE SHIELD | Admitting: Certified Registered Nurse Anesthetist

## 2018-09-30 ENCOUNTER — Observation Stay (HOSPITAL_COMMUNITY)
Admission: RE | Admit: 2018-09-30 | Discharge: 2018-10-01 | Disposition: A | Discharge status: Home / Self Care | Payer: BLUE CROSS/BLUE SHIELD | Source: Ambulatory Visit | Attending: Obstetrics & Gynecology | Admitting: Obstetrics & Gynecology

## 2018-09-30 DIAGNOSIS — Z882 Allergy status to sulfonamides status: Secondary | ICD-10-CM | POA: Insufficient documentation

## 2018-09-30 DIAGNOSIS — J45909 Unspecified asthma, uncomplicated: Secondary | ICD-10-CM | POA: Insufficient documentation

## 2018-09-30 DIAGNOSIS — Z87891 Personal history of nicotine dependence: Secondary | ICD-10-CM | POA: Insufficient documentation

## 2018-09-30 DIAGNOSIS — Z79899 Other long term (current) drug therapy: Secondary | ICD-10-CM | POA: Diagnosis not present

## 2018-09-30 DIAGNOSIS — Z23 Encounter for immunization: Secondary | ICD-10-CM | POA: Insufficient documentation

## 2018-09-30 DIAGNOSIS — E669 Obesity, unspecified: Secondary | ICD-10-CM | POA: Diagnosis not present

## 2018-09-30 DIAGNOSIS — N921 Excessive and frequent menstruation with irregular cycle: Secondary | ICD-10-CM | POA: Insufficient documentation

## 2018-09-30 DIAGNOSIS — N938 Other specified abnormal uterine and vaginal bleeding: Secondary | ICD-10-CM | POA: Diagnosis not present

## 2018-09-30 DIAGNOSIS — K219 Gastro-esophageal reflux disease without esophagitis: Secondary | ICD-10-CM | POA: Diagnosis not present

## 2018-09-30 DIAGNOSIS — Z6835 Body mass index (BMI) 35.0-35.9, adult: Secondary | ICD-10-CM | POA: Diagnosis not present

## 2018-09-30 DIAGNOSIS — Z8049 Family history of malignant neoplasm of other genital organs: Secondary | ICD-10-CM | POA: Diagnosis not present

## 2018-09-30 DIAGNOSIS — Z833 Family history of diabetes mellitus: Secondary | ICD-10-CM | POA: Insufficient documentation

## 2018-09-30 DIAGNOSIS — Z7951 Long term (current) use of inhaled steroids: Secondary | ICD-10-CM | POA: Insufficient documentation

## 2018-09-30 DIAGNOSIS — Z9889 Other specified postprocedural states: Secondary | ICD-10-CM

## 2018-09-30 HISTORY — PX: VAGINAL HYSTERECTOMY: SHX2639

## 2018-09-30 HISTORY — PX: LAPAROSCOPY: SHX197

## 2018-09-30 LAB — PREGNANCY, URINE: PREG TEST UR: NEGATIVE

## 2018-09-30 SURGERY — HYSTERECTOMY, VAGINAL
Anesthesia: General

## 2018-09-30 MED ORDER — PHENAZOPYRIDINE HCL 100 MG PO TABS
100.0000 mg | ORAL_TABLET | Freq: Once | ORAL | Status: AC
  Administered 2018-09-30: 100 mg via ORAL
  Filled 2018-09-30: qty 1

## 2018-09-30 MED ORDER — ROCURONIUM BROMIDE 100 MG/10ML IV SOLN
INTRAVENOUS | Status: AC
  Filled 2018-09-30: qty 1

## 2018-09-30 MED ORDER — GLYCOPYRROLATE 0.2 MG/ML IJ SOLN
INTRAMUSCULAR | Status: AC
  Filled 2018-09-30: qty 1

## 2018-09-30 MED ORDER — PROPOFOL 10 MG/ML IV BOLUS
INTRAVENOUS | Status: DC | PRN
  Administered 2018-09-30: 200 mg via INTRAVENOUS

## 2018-09-30 MED ORDER — HYDROMORPHONE HCL 1 MG/ML IJ SOLN
INTRAMUSCULAR | Status: AC
  Filled 2018-09-30: qty 0.5

## 2018-09-30 MED ORDER — KETOROLAC TROMETHAMINE 30 MG/ML IJ SOLN
INTRAMUSCULAR | Status: AC
  Filled 2018-09-30: qty 1

## 2018-09-30 MED ORDER — ONDANSETRON HCL 4 MG/2ML IJ SOLN
INTRAMUSCULAR | Status: AC
  Filled 2018-09-30: qty 2

## 2018-09-30 MED ORDER — SCOPOLAMINE 1 MG/3DAYS TD PT72
1.0000 | MEDICATED_PATCH | Freq: Once | TRANSDERMAL | Status: DC
  Administered 2018-09-30: 1.5 mg via TRANSDERMAL

## 2018-09-30 MED ORDER — LIDOCAINE HCL (CARDIAC) PF 100 MG/5ML IV SOSY
PREFILLED_SYRINGE | INTRAVENOUS | Status: DC | PRN
  Administered 2018-09-30: 100 mg via INTRAVENOUS

## 2018-09-30 MED ORDER — FLAVOXATE HCL 100 MG PO TABS
100.0000 mg | ORAL_TABLET | Freq: Once | ORAL | Status: DC
  Filled 2018-09-30: qty 1

## 2018-09-30 MED ORDER — SCOPOLAMINE 1 MG/3DAYS TD PT72
MEDICATED_PATCH | TRANSDERMAL | Status: AC
  Administered 2018-09-30: 1.5 mg via TRANSDERMAL
  Filled 2018-09-30: qty 1

## 2018-09-30 MED ORDER — DEXAMETHASONE SODIUM PHOSPHATE 10 MG/ML IJ SOLN
INTRAMUSCULAR | Status: AC
  Filled 2018-09-30: qty 1

## 2018-09-30 MED ORDER — ONDANSETRON HCL 4 MG PO TABS: 4.0000 mg | ORAL_TABLET | Freq: Four times a day (QID) | ORAL | Status: DC | PRN

## 2018-09-30 MED ORDER — LACTATED RINGERS IV SOLN
INTRAVENOUS | Status: DC
  Administered 2018-09-30: 08:00:00 via INTRAVENOUS
  Administered 2018-09-30: 125 mL/h via INTRAVENOUS

## 2018-09-30 MED ORDER — BUPIVACAINE HCL (PF) 0.5 % IJ SOLN
INTRAMUSCULAR | Status: DC | PRN
  Administered 2018-09-30: 4 mL
  Administered 2018-09-30: 3 mL

## 2018-09-30 MED ORDER — BUPIVACAINE-EPINEPHRINE 0.5% -1:200000 IJ SOLN
INTRAMUSCULAR | Status: DC | PRN
  Administered 2018-09-30: 30 mL

## 2018-09-30 MED ORDER — OXYCODONE-ACETAMINOPHEN 5-325 MG PO TABS: 1.0000 | ORAL_TABLET | ORAL | 0 refills | Status: DC | PRN

## 2018-09-30 MED ORDER — CEFAZOLIN SODIUM-DEXTROSE 2-4 GM/100ML-% IV SOLN
INTRAVENOUS | Status: AC
  Filled 2018-09-30: qty 100

## 2018-09-30 MED ORDER — HYDROMORPHONE HCL 1 MG/ML IJ SOLN
0.2000 mg | INTRAMUSCULAR | Status: DC | PRN
  Administered 2018-09-30: 0.6 mg via INTRAVENOUS
  Filled 2018-09-30: qty 1

## 2018-09-30 MED ORDER — ROCURONIUM BROMIDE 100 MG/10ML IV SOLN
INTRAVENOUS | Status: DC | PRN
  Administered 2018-09-30: 10 mg via INTRAVENOUS
  Administered 2018-09-30: 50 mg via INTRAVENOUS

## 2018-09-30 MED ORDER — GLYCOPYRROLATE 0.2 MG/ML IJ SOLN
INTRAMUSCULAR | Status: DC | PRN
  Administered 2018-09-30 (×2): 0.1 mg via INTRAVENOUS

## 2018-09-30 MED ORDER — HYDROMORPHONE HCL 1 MG/ML IJ SOLN
0.2500 mg | INTRAMUSCULAR | Status: DC | PRN
  Administered 2018-09-30: 0.25 mg via INTRAVENOUS

## 2018-09-30 MED ORDER — CEFAZOLIN SODIUM-DEXTROSE 2-4 GM/100ML-% IV SOLN
2.0000 g | INTRAVENOUS | Status: AC
  Administered 2018-09-30: 2 g via INTRAVENOUS

## 2018-09-30 MED ORDER — DEXAMETHASONE SODIUM PHOSPHATE 10 MG/ML IJ SOLN
INTRAMUSCULAR | Status: DC | PRN
  Administered 2018-09-30: 10 mg via INTRAVENOUS

## 2018-09-30 MED ORDER — HYDROMORPHONE HCL 1 MG/ML IJ SOLN
INTRAMUSCULAR | Status: AC
  Filled 2018-09-30: qty 1

## 2018-09-30 MED ORDER — MEPERIDINE HCL 25 MG/ML IJ SOLN
6.2500 mg | INTRAMUSCULAR | Status: DC | PRN
  Administered 2018-09-30: 6.25 mg via INTRAVENOUS

## 2018-09-30 MED ORDER — OXYCODONE-ACETAMINOPHEN 5-325 MG PO TABS
1.0000 | ORAL_TABLET | ORAL | Status: DC | PRN
  Administered 2018-09-30 – 2018-10-01 (×5): 1 via ORAL
  Filled 2018-09-30 (×5): qty 1

## 2018-09-30 MED ORDER — SODIUM CHLORIDE 0.9 % IJ SOLN
INTRAMUSCULAR | Status: DC | PRN
  Administered 2018-09-30: 100 mL

## 2018-09-30 MED ORDER — MEPERIDINE HCL 25 MG/ML IJ SOLN
INTRAMUSCULAR | Status: AC
  Filled 2018-09-30: qty 1

## 2018-09-30 MED ORDER — ONDANSETRON HCL 4 MG/2ML IJ SOLN: 4.0000 mg | Freq: Four times a day (QID) | INTRAMUSCULAR | Status: DC | PRN

## 2018-09-30 MED ORDER — KETOROLAC TROMETHAMINE 30 MG/ML IJ SOLN: 30.0000 mg | Freq: Once | INTRAMUSCULAR | Status: DC | PRN

## 2018-09-30 MED ORDER — MIDAZOLAM HCL 2 MG/2ML IJ SOLN
INTRAMUSCULAR | Status: DC | PRN
  Administered 2018-09-30: 1.5 mg via INTRAVENOUS
  Administered 2018-09-30: 0.5 mg via INTRAVENOUS

## 2018-09-30 MED ORDER — PROMETHAZINE HCL 25 MG/ML IJ SOLN: 6.2500 mg | INTRAMUSCULAR | Status: DC | PRN

## 2018-09-30 MED ORDER — ONDANSETRON HCL 4 MG/2ML IJ SOLN
INTRAMUSCULAR | Status: DC | PRN
  Administered 2018-09-30 (×2): 2 mg via INTRAVENOUS

## 2018-09-30 MED ORDER — KETOROLAC TROMETHAMINE 30 MG/ML IJ SOLN
INTRAMUSCULAR | Status: DC | PRN
  Administered 2018-09-30: 30 mg via INTRAVENOUS

## 2018-09-30 MED ORDER — HYDROMORPHONE HCL 1 MG/ML IJ SOLN
INTRAMUSCULAR | Status: DC | PRN
  Administered 2018-09-30: 1 mg via INTRAVENOUS

## 2018-09-30 MED ORDER — SUGAMMADEX SODIUM 200 MG/2ML IV SOLN
INTRAVENOUS | Status: DC | PRN
  Administered 2018-09-30: 200 mg via INTRAVENOUS

## 2018-09-30 MED ORDER — LIDOCAINE HCL (CARDIAC) PF 100 MG/5ML IV SOSY
PREFILLED_SYRINGE | INTRAVENOUS | Status: AC
  Filled 2018-09-30: qty 5

## 2018-09-30 MED ORDER — SUGAMMADEX SODIUM 200 MG/2ML IV SOLN
INTRAVENOUS | Status: AC
  Filled 2018-09-30: qty 2

## 2018-09-30 MED ORDER — SODIUM CHLORIDE 0.9 % IJ SOLN
INTRAMUSCULAR | Status: AC
  Filled 2018-09-30: qty 100

## 2018-09-30 MED ORDER — INFLUENZA VAC SPLIT QUAD 0.5 ML IM SUSY
0.5000 mL | PREFILLED_SYRINGE | INTRAMUSCULAR | Status: AC
  Administered 2018-10-01: 0.5 mL via INTRAMUSCULAR
  Filled 2018-09-30: qty 0.5

## 2018-09-30 MED ORDER — PROPOFOL 10 MG/ML IV BOLUS
INTRAVENOUS | Status: AC
  Filled 2018-09-30: qty 20

## 2018-09-30 MED ORDER — BUPIVACAINE-EPINEPHRINE (PF) 0.5% -1:200000 IJ SOLN
INTRAMUSCULAR | Status: AC
  Filled 2018-09-30: qty 30

## 2018-09-30 MED ORDER — IBUPROFEN 800 MG PO TABS
800.0000 mg | ORAL_TABLET | Freq: Three times a day (TID) | ORAL | Status: DC | PRN
  Administered 2018-09-30 – 2018-10-01 (×3): 800 mg via ORAL
  Filled 2018-09-30 (×3): qty 1

## 2018-09-30 MED ORDER — MIDAZOLAM HCL 2 MG/2ML IJ SOLN
INTRAMUSCULAR | Status: AC
  Filled 2018-09-30: qty 2

## 2018-09-30 MED ORDER — LACTATED RINGERS IV SOLN
INTRAVENOUS | Status: DC
  Administered 2018-09-30: 15:00:00 via INTRAVENOUS

## 2018-09-30 MED ORDER — BUPIVACAINE HCL (PF) 0.5 % IJ SOLN
INTRAMUSCULAR | Status: AC
  Filled 2018-09-30: qty 30

## 2018-09-30 MED ORDER — FENTANYL CITRATE (PF) 100 MCG/2ML IJ SOLN
INTRAMUSCULAR | Status: DC | PRN
  Administered 2018-09-30: 50 ug via INTRAVENOUS
  Administered 2018-09-30 (×2): 100 ug via INTRAVENOUS

## 2018-09-30 MED ORDER — PNEUMOCOCCAL VAC POLYVALENT 25 MCG/0.5ML IJ INJ
0.5000 mL | INJECTION | INTRAMUSCULAR | Status: DC
  Filled 2018-09-30: qty 0.5

## 2018-09-30 MED ORDER — FENTANYL CITRATE (PF) 250 MCG/5ML IJ SOLN
INTRAMUSCULAR | Status: AC
  Filled 2018-09-30: qty 5

## 2018-09-30 SURGICAL SUPPLY — 43 items
APPLICATOR ARISTA FLEXITIP XL (MISCELLANEOUS) IMPLANT
APPLICATOR COTTON TIP 6 STRL (MISCELLANEOUS) ×2 IMPLANT
APPLICATOR COTTON TIP 6IN STRL (MISCELLANEOUS) ×4
CANISTER SUCT 3000ML PPV (MISCELLANEOUS) ×4 IMPLANT
CONT PATH 16OZ SNAP LID 3702 (MISCELLANEOUS) ×4 IMPLANT
COVER MAYO STAND STRL (DRAPES) ×4 IMPLANT
DECANTER SPIKE VIAL GLASS SM (MISCELLANEOUS) ×8 IMPLANT
DRSG OPSITE POSTOP 3X4 (GAUZE/BANDAGES/DRESSINGS) ×4 IMPLANT
DURAPREP 26ML APPLICATOR (WOUND CARE) ×4 IMPLANT
ELECT REM PT RETURN 9FT ADLT (ELECTROSURGICAL) ×4
ELECTRODE REM PT RTRN 9FT ADLT (ELECTROSURGICAL) ×2 IMPLANT
GLOVE BIO SURGEON STRL SZ 6.5 (GLOVE) ×9 IMPLANT
GLOVE BIO SURGEONS STRL SZ 6.5 (GLOVE) ×3
GLOVE BIOGEL PI IND STRL 7.0 (GLOVE) ×4 IMPLANT
GLOVE BIOGEL PI INDICATOR 7.0 (GLOVE) ×4
HEMOSTAT ARISTA ABSORB 3G PWDR (MISCELLANEOUS) IMPLANT
LEGGING LITHOTOMY PAIR STRL (DRAPES) ×4 IMPLANT
NDL SAFETY ECLIPSE 18X1.5 (NEEDLE) ×2 IMPLANT
NEEDLE HYPO 18GX1.5 SHARP (NEEDLE) ×2
NEEDLE INSUFFLATION 120MM (ENDOMECHANICALS) ×4 IMPLANT
NEEDLE MAYO 6 CRC TAPER PT (NEEDLE) ×4 IMPLANT
NEEDLE SPNL 18GX3.5 QUINCKE PK (NEEDLE) ×4 IMPLANT
NS IRRIG 1000ML POUR BTL (IV SOLUTION) ×4 IMPLANT
PACK LAVH (CUSTOM PROCEDURE TRAY) ×4 IMPLANT
PACK ROBOTIC GOWN (GOWN DISPOSABLE) ×4 IMPLANT
PACK TRENDGUARD 450 HYBRID PRO (MISCELLANEOUS) ×2 IMPLANT
PROTECTOR NERVE ULNAR (MISCELLANEOUS) ×8 IMPLANT
SET IRRIG TUBING LAPAROSCOPIC (IRRIGATION / IRRIGATOR) IMPLANT
SHEARS HARMONIC ACE PLUS 36CM (ENDOMECHANICALS) IMPLANT
SLEEVE XCEL OPT CAN 5 100 (ENDOMECHANICALS) ×8 IMPLANT
SUT VIC AB 0 CT1 36 (SUTURE) ×4 IMPLANT
SUT VIC AB 2-0 CT1 18 (SUTURE) ×12 IMPLANT
SUT VICRYL 0 UR6 27IN ABS (SUTURE) ×8 IMPLANT
SUT VICRYL 4-0 PS2 18IN ABS (SUTURE) ×8 IMPLANT
SYR 30ML LL (SYRINGE) ×4 IMPLANT
SYR BULB IRRIGATION 50ML (SYRINGE) ×4 IMPLANT
TOWEL OR 17X24 6PK STRL BLUE (TOWEL DISPOSABLE) ×12 IMPLANT
TRAY FOLEY W/BAG SLVR 14FR (SET/KITS/TRAYS/PACK) ×4 IMPLANT
TRENDGUARD 450 HYBRID PRO PACK (MISCELLANEOUS) ×4
TROCAR OPTI TIP 5M 100M (ENDOMECHANICALS) ×4 IMPLANT
TUBING INSUF HEATED (TUBING) ×4 IMPLANT
WARMER LAPAROSCOPE (MISCELLANEOUS) ×4 IMPLANT
YANKAUER SUCT BULB TIP NO VENT (SUCTIONS) ×4 IMPLANT

## 2018-09-30 NOTE — Addendum Note (Signed)
Addendum  created 09/30/18 1210 by Lenox Ahr, CRNA   Charge Capture section accepted

## 2018-09-30 NOTE — Discharge Instructions (Signed)
Vaginal Hysterectomy, Care After °Refer to this sheet in the next few weeks. These instructions provide you with information about caring for yourself after your procedure. Your health care provider may also give you more specific instructions. Your treatment has been planned according to current medical practices, but problems sometimes occur. Call your health care provider if you have any problems or questions after your procedure. °What can I expect after the procedure? °After the procedure, it is common to have: °· Pain. °· Soreness and numbness in your incision areas. °· Vaginal bleeding and discharge. °· Constipation. °· Temporary problems emptying the bladder. °· Feelings of sadness or other emotions. ° °Follow these instructions at home: °Medicines °· Take over-the-counter and prescription medicines only as told by your health care provider. °· If you were prescribed an antibiotic medicine, take it as told by your health care provider. Do not stop taking the antibiotic even if you start to feel better. °· Do not drive or operate heavy machinery while taking prescription pain medicine. °Activity °· Return to your normal activities as told by your health care provider. Ask your health care provider what activities are safe for you. °· Get regular exercise as told by your health care provider. You may be told to take short walks every day and go farther each time. °· Do not lift anything that is heavier than 10 lb (4.5 kg). °General instructions ° °· Do not put anything in your vagina for 6 weeks after your surgery or as told by your health care provider. This includes tampons and douches. °· Do not have sex until your health care provider says you can. °· Do not take baths, swim, or use a hot tub until your health care provider approves. °· Drink enough fluid to keep your urine clear or pale yellow. °· Do not drive for 24 hours if you were given a sedative. °· Keep all follow-up visits as told by your health  care provider. This is important. °Contact a health care provider if: °· Your pain medicine is not helping. °· You have a fever. °· You have redness, swelling, or pain at your incision site. °· You have blood, pus, or a bad-smelling discharge from your vagina. °· You continue to have difficulty urinating. °Get help right away if: °· You have severe abdominal or back pain. °· You have heavy bleeding from your vagina. °· You have chest pain or shortness of breath. °This information is not intended to replace advice given to you by your health care provider. Make sure you discuss any questions you have with your health care provider. °Document Released: 03/12/2016 Document Revised: 04/26/2016 Document Reviewed: 12/04/2015 °Elsevier Interactive Patient Education © 2018 [employer] ° °

## 2018-09-30 NOTE — Anesthesia Preprocedure Evaluation (Signed)
Anesthesia Evaluation  Patient identified by MRN, date of birth, ID band Patient awake    Reviewed: Allergy & Precautions, H&P , NPO status   Airway Mallampati: I       Dental no notable dental hx. (+) Teeth Intact   Pulmonary asthma , former smoker,  Inhaler prior to OR   Pulmonary exam normal breath sounds clear to auscultation       Cardiovascular negative cardio ROS Normal cardiovascular exam Rhythm:Regular Rate:Normal     Neuro/Psych    GI/Hepatic Neg liver ROS,   Endo/Other  negative endocrine ROS  Renal/GU negative Renal ROS  negative genitourinary   Musculoskeletal negative musculoskeletal ROS (+)   Abdominal (+) + obese,   Peds  Hematology negative hematology ROS (+)   Anesthesia Other Findings   Reproductive/Obstetrics                             Anesthesia Physical Anesthesia Plan  ASA: II  Anesthesia Plan: General   Post-op Pain Management:    Induction: Intravenous  PONV Risk Score and Plan: 4 or greater and Ondansetron, Dexamethasone, Midazolam and Scopolamine patch - Pre-op  Airway Management Planned: Oral ETT  Additional Equipment:   Intra-op Plan:   Post-operative Plan: Extubation in OR  Informed Consent: I have reviewed the patients History and Physical, chart, labs and discussed the procedure including the risks, benefits and alternatives for the proposed anesthesia with the patient or authorized representative who has indicated his/her understanding and acceptance.   Dental advisory given  Plan Discussed with:   Anesthesia Plan Comments:         Anesthesia Quick Evaluation

## 2018-09-30 NOTE — Op Note (Signed)
09/30/2018  8:58 AM  PATIENT:  Anne Dixon  42 y.o. female  PRE-OPERATIVE DIAGNOSIS:  DUB  POST-OPERATIVE DIAGNOSIS:  DUB  PROCEDURE:  Procedure(s): HYSTERECTOMY VAGINAL (N/A) LAPAROSCOPY DIAGNOSTIC  SURGEON:  Surgeon(s) and Role:    * Marice Potter, Leanora Ivanoff, MD - Primary    * Conan Bowens, MD - Assisting  ANESTHESIA:   local and general  EBL:  25 mL   BLOOD ADMINISTERED:none  DRAINS: none   LOCAL MEDICATIONS USED:  MARCAINE     SPECIMEN:  Source of Specimen:  uterus  DISPOSITION OF SPECIMEN:  PATHOLOGY  COUNTS:  YES  TOURNIQUET:  * No tourniquets in log *  DICTATION: .Dragon Dictation  PLAN OF CARE: Admit to inpatient   PATIENT DISPOSITION:  PACU - hemodynamically stable.   Delay start of Pharmacological VTE agent (>24hrs) due to surgical blood loss or risk of bleeding: not applicable   The risks, benefits, and alternatives of surgery were explained, accepted, and understood. Consents were signed. She was taken to the operating room and placed in the dorsal lithotomy position. When she was comfortable, general anesthesia was applied without complication. Her abdomen and vagina were prepped and draped in the usual sterile fashion. A bimanual exam revealed a small anteverted uterus with moderate prolapse, her adnexa were not palpable. A Hulka manipulator was placed. A Foley catheter was placed and a drained clear urine throughout the case. Gloves were changed and attention was turned to the abdomen. 0.5% Marcaine was used to inject at  the incision site in the left upper quadrant prior to making an incision. A 5mm incision was made. A Veress needle was placed intraperitoneally. Low-flow CO2 was used to insufflate the abdomen to approximately 3 L. After an excellent pneumoperitoneum was established a 5mm trocar was placed. Laparoscopy confirmed correct placement. The pelvis was inspected. There was a small omental adhesion at the umbilicus. Her ovaries and  liver appeared  normal. There were no adhesions to the bladder from her previous cesarean section. I allowed the CO2 to escape the abdomen. I closed the subcuticular tissue with a 4-0 vicryl suture. I then moved to the vaginal approach. I used a solution of 30cc of 0.5% marcaine with 10l0 cc of normal saline to hydrodisect at the cervicovaginal junction. I made a circumferential incision at the site. The posterior peritoneum was entered sharply and a long weighted speculum was placed. I ligated, tagged, and held the uterosacral ligaments. 2-0 vicryl suture is used throughout unless otherwise specified.  I entered the anterior peritoneum as well. I then continued to separate the uterus from its pelvic attachments. The uterus was removed.  Excellent hemostasis was maintained throughout. I used the tags from the uterosacral ligaments to provide angle sutures for vaginal cuff support. I closed the vaginal cuff in a horizontal fashion with a 0 vicryl suture in a running, locking fashion.She was extubated and taken to the recovery room in stable condition.

## 2018-09-30 NOTE — Anesthesia Procedure Notes (Signed)
Procedure Name: Intubation Date/Time: 09/30/2018 7:43 AM Performed by: Leilani Able, MD Pre-anesthesia Checklist: Patient identified, Patient being monitored, Timeout performed, Emergency Drugs available and Suction available Patient Re-evaluated:Patient Re-evaluated prior to induction Oxygen Delivery Method: Circle System Utilized Preoxygenation: Pre-oxygenation with 100% oxygen Induction Type: IV induction Ventilation: Mask ventilation without difficulty Laryngoscope Size: Miller and 2 Grade View: Grade II Tube type: Oral Tube size: 7.0 mm Number of attempts: 1 Airway Equipment and Method: stylet Placement Confirmation: ETT inserted through vocal cords under direct vision,  positive ETCO2 and breath sounds checked- equal and bilateral Secured at: 21 cm Tube secured with: Tape Dental Injury: Teeth and Oropharynx as per pre-operative assessment

## 2018-09-30 NOTE — Transfer of Care (Signed)
Immediate Anesthesia Transfer of Care Note  Patient: Anne Dixon  Procedure(s) Performed: HYSTERECTOMY VAGINAL (N/A ) LAPAROSCOPY DIAGNOSTIC  Patient Location: PACU  Anesthesia Type:General  Level of Consciousness: awake, alert  and oriented  Airway & Oxygen Therapy: Patient Spontanous Breathing and Patient connected to nasal cannula oxygen  Post-op Assessment: Report given to RN, Post -op Vital signs reviewed and stable and Patient moving all extremities X 4  Post vital signs: Reviewed and stable  Last Vitals:  Vitals Value Taken Time  BP 132/60 09/30/2018  9:17 AM  Temp    Pulse 97 09/30/2018  9:21 AM  Resp 0 09/30/2018  9:21 AM  SpO2 99 % 09/30/2018  9:21 AM  Vitals shown include unvalidated device data.  Last Pain:  Vitals:   09/30/18 0627  TempSrc: Oral      Patients Stated Pain Goal: 3 (09/30/18 0981)  Complications: No apparent anesthesia complications

## 2018-09-30 NOTE — H&P (Signed)
Anne Dixon is a 42 yo married P2 (8 and 4 yo kids- 1 cesarean section) here today a hysterectomy. She has a long h/o menometrorrhagia. She had an Essure placed at [facility]. She was told that there were not "seated right" and she had both tubes removed via laparoscopy in about 2015 or 2016.  She then tried a d&c and HTA on 01/24/17. She declined an IUD. She used OCPs for many years and her periods were still heavy. She also tried a Mirena IUD in 2018 but still had too much bleeding. Her hbg was 13.7 in 2018 and she reports that she gets it annually at work ([employer]) and is always good, as is her TSH there.  She tracks her bleeding on her phone and has bleeding quite frequently. It is not heavy generally, but is very annoying.   No LMP recorded.    Past Medical History:  Diagnosis Date  . Asthma     allergy related seasonal under control  . GERD (gastroesophageal reflux disease)    with pregnancy  . Seasonal allergies     Past Surgical History:  Procedure Laterality Date  . CESAREAN SECTION    . DIAGNOSTIC LAPAROSCOPY    . Fallopian Tube Removal  03/22/2016  . HYSTEROSCOPY N/A 01/24/2017   Procedure: DILATATION AND CURRETAGE, HYDROTHERMAL ABLATION REMOVAL OF IUD;  Surgeon: Lesly Dukes, MD;  Location: [facility];  Service: Gynecology;  Laterality: N/A;    Family History  Problem Relation Age of Onset  . Diabetes Paternal Grandfather   . Diabetes Paternal Grandmother   . Diabetes Maternal Grandmother   . Diabetes Maternal Grandfather   . Diabetes Father   . Cervical cancer Sister     Social History:  reports that she quit smoking about 9 years ago. Her smoking use included cigarettes. She has a 7.50 pack-year smoking history. She has never used smokeless tobacco. She reports that she drinks alcohol. She reports that she has current or past drug history. Drug: Marijuana.  Allergies:  Allergies  Allergen Reactions  . Sulfa Antibiotics Rash and Other (See Comments)     Maculopapular eruption on arms and upper chest 04/25/2015    Medications Prior to Admission  Medication Sig Dispense Refill Last Dose  . albuterol (PROVENTIL HFA;VENTOLIN HFA) 108 (90 Base) MCG/ACT inhaler Inhale 2 puffs into the lungs every 6 (six) hours as needed for wheezing or shortness of breath.   09/30/2018 at 0530  . budesonide-formoterol (SYMBICORT) 80-4.5 MCG/ACT inhaler Inhale 2 puffs into the lungs 2 (two) times daily as needed (for shortness of breath).   Past Month at Unknown time  . cetirizine (ZYRTEC) 10 MG tablet Take 10 mg by mouth at bedtime.    09/29/2018 at Unknown time  . ibuprofen (ADVIL,MOTRIN) 200 MG tablet Take 600 mg by mouth every 6 (six) hours as needed for headache or moderate pain.   Past Month at Unknown time  . montelukast (SINGULAIR) 10 MG tablet Take 10 mg by mouth at bedtime.    09/29/2018 at Unknown time  . valACYclovir (VALTREX) 500 MG tablet Take 1,000 mg by mouth 3 (three) times daily as needed (for fever blisters).    Past Month at Unknown time  . 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 rate 16, SpO2 98 %. Physical Exam  Heart- rrr Lungs- CTAB Abd- benign  Results for orders placed or performed during the hospital encounter of 09/30/18 (from the past 24 hour(s))  Pregnancy, urine     Status: None   Collection Time: 09/30/18  6:00 AM  Result Value Ref Range   Preg Test, Ur NEGATIVE NEGATIVE    No results found.  Assessment/Plan: DUB- plan for LAVH  She understands the risks of surgery, including, but not to infection, bleeding, DVTs, damage to bowel, bladder, ureters. She wishes to proceed.     Allie Bossier 09/30/2018, 7:20 AM

## 2018-09-30 NOTE — Plan of Care (Signed)
  Problem: Nutrition: Goal: Adequate nutrition will be maintained Outcome: Progressing   Problem: Pain Managment: Goal: General experience of comfort will improve Outcome: Progressing   Problem: Safety: Goal: Ability to remain free from injury will improve Outcome: Progressing   

## 2018-09-30 NOTE — Anesthesia Postprocedure Evaluation (Signed)
Anesthesia Post Note  Patient: Anne Dixon  Procedure(s) Performed: HYSTERECTOMY VAGINAL (N/A ) LAPAROSCOPY DIAGNOSTIC     Patient location during evaluation: PACU Anesthesia Type: General Level of consciousness: awake Pain management: pain level controlled Vital Signs Assessment: post-procedure vital signs reviewed and stable Respiratory status: spontaneous breathing Cardiovascular status: stable Postop Assessment: no apparent nausea or vomiting Anesthetic complications: no    Last Vitals:  Vitals:   09/30/18 1030 09/30/18 1041  BP: 118/72 123/87  Pulse: 89 84  Resp: 17 18  Temp:  36.5 C  SpO2: 99% 98%    Last Pain:  Vitals:   09/30/18 1041  TempSrc: Oral  PainSc:    Pain Goal: Patients Stated Pain Goal: 3 (09/30/18 0627)               Caren Macadam

## 2018-10-01 ENCOUNTER — Encounter (HOSPITAL_COMMUNITY): Payer: Self-pay | Admitting: Obstetrics & Gynecology

## 2018-10-01 DIAGNOSIS — N938 Other specified abnormal uterine and vaginal bleeding: Secondary | ICD-10-CM | POA: Diagnosis not present

## 2018-10-01 LAB — CBC
HEMATOCRIT: 38.2 % (ref 36.0–46.0)
HEMOGLOBIN: 12.9 g/dL (ref 12.0–15.0)
MCH: 30.6 pg (ref 26.0–34.0)
MCHC: 33.8 g/dL (ref 30.0–36.0)
MCV: 90.5 fL (ref 80.0–100.0)
PLATELETS: 225 10*3/uL (ref 150–400)
RBC: 4.22 MIL/uL (ref 3.87–5.11)
RDW: 12.8 % (ref 11.5–15.5)
WBC: 15 10*3/uL — ABNORMAL HIGH (ref 4.0–10.5)
nRBC: 0 % (ref 0.0–0.2)

## 2018-10-01 NOTE — Discharge Summary (Signed)
Physician Discharge Summary  Patient ID: Anne Dixon MRN: [id number] DOB/AGE: 05/16/1976 42 y.o.  Admit date: 09/30/2018 Discharge date: 10/01/2018  Admission Diagnoses: DUB  Discharge Diagnoses: DUB Active Problems:   Post-operative state   Discharged Condition: good  Hospital Course: She underwent an uncomplicated LAVH. On POD #0 she was voiding, tolerating po well, and having flatus. By POD #1 she voiced her readiness to go home.   Consults: None  Significant Diagnostic Studies: labs: Her post op hbg was 12.9  Treatments: surgery: LAVH  Discharge Exam: Blood pressure 118/81, pulse 65, temperature 97.9 F (36.6 C), temperature source Oral, resp. rate 16, height 5\' 2"  (1.575 m), weight 86.8 kg, SpO2 100 %. General appearance: alert Resp: clear to auscultation bilaterally Cardio: regular rate and rhythm, S1, S2 normal, no murmur, click, rub or gallop GI: soft, non-tender; bowel sounds normal; no masses,  no organomegaly Incision/Wound: dressing at her 5 mm laparoscopy incision was clean, dry, and intact  Disposition: Discharge disposition: 01-Home or Self Care         Follow-up Information    Allie Bossier, MD. Schedule an appointment as soon as possible for a visit in 5 weeks.   Specialty:  Obstetrics and Gynecology Contact information: [address] [place] [address] [phone]           Signed: Allie Bossier 10/01/2018, 9:23 AM

## 2018-10-01 NOTE — Addendum Note (Signed)
Addendum  created 10/01/18 0851 by Elgie Congo, CRNA   Sign clinical note

## 2018-10-01 NOTE — Anesthesia Postprocedure Evaluation (Signed)
Anesthesia Post Note  Patient: Anne Dixon  Procedure(s) Performed: HYSTERECTOMY VAGINAL (N/A ) LAPAROSCOPY DIAGNOSTIC     Patient location during evaluation: [facility] Anesthesia Type: General Level of consciousness: awake and alert Pain management: pain level controlled Vital Signs Assessment: post-procedure vital signs reviewed and stable Respiratory status: spontaneous breathing, nonlabored ventilation and respiratory function stable Cardiovascular status: stable Postop Assessment: no apparent nausea or vomiting, adequate PO intake and able to ambulate Anesthetic complications: no    Last Vitals:  Vitals:   10/01/18 0400 10/01/18 0802  BP: (!) 111/46 118/81  Pulse: (!) 57 65  Resp: 16 16  Temp: 36.7 C 36.6 C  SpO2: 98% 100%    Last Pain:  Vitals:   10/01/18 0802  TempSrc: Oral  PainSc:    Pain Goal: Patients Stated Pain Goal: 3 (10/01/18 0730)               Laban Emperor

## 2018-11-06 ENCOUNTER — Ambulatory Visit: Payer: BLUE CROSS/BLUE SHIELD | Admitting: Obstetrics & Gynecology

## 2018-11-06 VITALS — BP 136/84 | HR 81 | Resp 16 | Ht 64.0 in | Wt 193.0 lb

## 2018-11-06 DIAGNOSIS — Z9889 Other specified postprocedural states: Secondary | ICD-10-CM

## 2018-11-06 NOTE — Progress Notes (Signed)
   Subjective:    Patient ID: Anne Dixon, female    DOB: 05/24/1976, 42 y.o.   MRN: [id number]  HPI 42 yo married P2 here for a post op visit. She had a LAVH/BS on 09/20/18. She reports normal bowel and bladder function. She has not had vaginal intercourse since surgery. She is having no problems and feels ready to go to work next week.   Review of Systems     Objective:   Physical Exam        Assessment & Plan:

## 2019-01-28 ENCOUNTER — Ambulatory Visit (INDEPENDENT_AMBULATORY_CARE_PROVIDER_SITE_OTHER): Payer: BLUE CROSS/BLUE SHIELD

## 2019-01-28 DIAGNOSIS — Z1231 Encounter for screening mammogram for malignant neoplasm of breast: Secondary | ICD-10-CM | POA: Diagnosis not present

## 2019-12-03 ENCOUNTER — Encounter

## 2020-07-26 ENCOUNTER — Other Ambulatory Visit: Payer: Self-pay | Admitting: Obstetrics and Gynecology

## 2020-07-26 DIAGNOSIS — Z1231 Encounter for screening mammogram for malignant neoplasm of breast: Secondary | ICD-10-CM

## 2020-07-28 ENCOUNTER — Ambulatory Visit (INDEPENDENT_AMBULATORY_CARE_PROVIDER_SITE_OTHER): Payer: BC Managed Care – PPO

## 2020-07-28 ENCOUNTER — Other Ambulatory Visit: Payer: Self-pay

## 2020-07-28 DIAGNOSIS — Z1231 Encounter for screening mammogram for malignant neoplasm of breast: Secondary | ICD-10-CM | POA: Diagnosis not present

## 2021-07-11 ENCOUNTER — Other Ambulatory Visit: Payer: Self-pay | Admitting: Obstetrics and Gynecology

## 2021-07-11 DIAGNOSIS — Z1231 Encounter for screening mammogram for malignant neoplasm of breast: Secondary | ICD-10-CM

## 2021-08-02 ENCOUNTER — Ambulatory Visit (INDEPENDENT_AMBULATORY_CARE_PROVIDER_SITE_OTHER): Payer: BC Managed Care – PPO

## 2021-08-02 DIAGNOSIS — Z1231 Encounter for screening mammogram for malignant neoplasm of breast: Secondary | ICD-10-CM | POA: Diagnosis not present

## 2021-10-05 ENCOUNTER — Other Ambulatory Visit: Payer: Self-pay

## 2021-10-05 ENCOUNTER — Encounter: Payer: Self-pay | Admitting: Obstetrics and Gynecology

## 2021-10-05 ENCOUNTER — Ambulatory Visit (INDEPENDENT_AMBULATORY_CARE_PROVIDER_SITE_OTHER): Payer: BC Managed Care – PPO | Admitting: Obstetrics and Gynecology

## 2021-10-05 VITALS — BP 128/78 | HR 81 | Resp 16 | Ht 64.0 in | Wt 203.0 lb

## 2021-10-05 DIAGNOSIS — Z01419 Encounter for gynecological examination (general) (routine) without abnormal findings: Secondary | ICD-10-CM | POA: Diagnosis not present

## 2021-10-05 NOTE — Progress Notes (Signed)
GYNECOLOGY ANNUAL PREVENTATIVE CARE ENCOUNTER NOTE  History:     Anne Dixon is a 45 y.o. [phone] female here for a routine annual gynecologic exam.    She is s/p LAVH/BS on 09/20/18.   Current complaints: she noted spotting but last intercourse was 3d prior. Doesn't feel like painful or scratch occur. Just noted it with wiping. No urinary symptoms. Not from rectum.      Denies abnormal vaginal bleeding, discharge, pelvic pain, problems with intercourse or other gynecologic concerns.     Gynecologic History Patient's last menstrual period was 07/21/2018.  Last Pap:     Component Value Date/Time   DIAGPAP  08/06/2018 0000    NEGATIVE FOR INTRAEPITHELIAL LESIONS OR MALIGNANCY.   ADEQPAP  08/06/2018 0000    Satisfactory for evaluation  endocervical/transformation zone component PRESENT.  - No h/o abnormal paps or leep procedures.  Last Mammogram: 08/2021.  Result was normal Last Colonoscopy: not yet done  Obstetric History OB History  Gravida Para Term Preterm AB Living  2 2 2     2   SAB IAB Ectopic Multiple Live Births               # Outcome Date GA Lbr Len/2nd Weight Sex Delivery Anes PTL Lv  2 Term           1 Term             Past Medical History:  Diagnosis Date   Asthma     allergy related seasonal under control   GERD (gastroesophageal reflux disease)    with pregnancy   Seasonal allergies     Past Surgical History:  Procedure Laterality Date   CESAREAN SECTION     DIAGNOSTIC LAPAROSCOPY     Fallopian Tube Removal  03/22/2016   HYSTEROSCOPY N/A 01/24/2017   Procedure: DILATATION AND CURRETAGE, HYDROTHERMAL ABLATION REMOVAL OF IUD;  Surgeon: 01/26/2017, MD;  Location: [facility];  Service: Gynecology;  Laterality: N/A;   LAPAROSCOPY  09/30/2018   Procedure: LAPAROSCOPY DIAGNOSTIC;  Surgeon: 10/02/2018, MD;  Location: [facility];  Service: Gynecology;;   VAGINAL HYSTERECTOMY N/A 09/30/2018   Procedure: HYSTERECTOMY VAGINAL;  Surgeon: 10/02/2018, MD;  Location: [facility];  Service: Gynecology;  Laterality: N/A;    Current Outpatient Medications on File Prior to Visit  Medication Sig Dispense Refill   albuterol (PROVENTIL HFA;VENTOLIN HFA) 108 (90 Base) MCG/ACT inhaler Inhale 2 puffs into the lungs every 6 (six) hours as needed for wheezing or shortness of breath.     budesonide-formoterol (SYMBICORT) 80-4.5 MCG/ACT inhaler Inhale 2 puffs into the lungs 2 (two) times daily as needed (for shortness of breath).     cetirizine (ZYRTEC) 10 MG tablet Take 10 mg by mouth at bedtime.      ibuprofen (ADVIL,MOTRIN) 200 MG tablet Take 600 mg by mouth every 6 (six) hours as needed for headache or moderate pain.     montelukast (SINGULAIR) 10 MG tablet Take 10 mg by mouth at bedtime.      valACYclovir (VALTREX) 500 MG tablet Take 1,000 mg by mouth 3 (three) times daily as needed (for fever blisters).      No current facility-administered medications on file prior to visit.    Allergies  Allergen Reactions   Sulfa Antibiotics Rash and Other (See Comments)    Maculopapular eruption on arms and upper chest 04/25/2015    Social History:  reports that she quit smoking about 12 years ago.  Her smoking use included cigarettes. She has a 7.50 pack-year smoking history. She has never used smokeless tobacco. She reports current alcohol use. She reports current drug use. Drug: Marijuana.  Family History  Problem Relation Age of Onset   Diabetes Paternal Grandfather    Diabetes Paternal Grandmother    Diabetes Maternal Grandmother    Diabetes Maternal Grandfather    Diabetes Father    Cervical cancer Sister     The following portions of the patient's history were reviewed and updated as appropriate: allergies, current medications, past family history, past medical history, past social history, past surgical history and problem list.  Review of Systems Pertinent items noted in HPI and remainder of comprehensive ROS otherwise negative.  Physical  Exam:  LMP 07/21/2018  CONSTITUTIONAL: Well-developed, well-nourished female in no acute distress.  HENT:  Normocephalic, atraumatic, External right and left ear normal.  EYES: Conjunctivae and EOM are normal. Pupils are equal, round, and reactive to light. No scleral icterus.  NECK: Normal range of motion, supple, no masses.  Normal thyroid.  SKIN: Skin is warm and dry. No rash noted. Not diaphoretic. No erythema. No pallor. MUSCULOSKELETAL: Normal range of motion. No tenderness.  No cyanosis, clubbing, or edema. NEUROLOGIC: Alert and oriented to person, place, and time. Normal reflexes, muscle tone coordination.  PSYCHIATRIC: Normal mood and affect. Normal behavior. Normal judgment and thought content.  CARDIOVASCULAR: Normal heart rate noted, regular rhythm RESPIRATORY: Clear to auscultation bilaterally. Effort and breath sounds normal, no problems with respiration noted.  BREASTS: Symmetric in size. No masses, tenderness, skin changes, nipple drainage, or lymphadenopathy bilaterally. Performed in the presence of a chaperone. ABDOMEN: Soft, no distention noted.  No tenderness, rebound or guarding.  PELVIC: External genitalia normal, Vagina normal without discharge, Urethra without abnormality or discharge, no bladder tenderness, no adnexal masses or tenderness, uterus surgically absent. Performed in the presence of a chaperone.  Assessment and Plan:    1. Encounter for annual routine gynecological examination - Cervical cancer screening: Discussed guidelines. Pap with HPV done in 2019 and wnl - Gardasil:  Never had - STD Testing: not indicated - Breast Health: Encouraged self breast awareness/SBE. Teaching provided. Discussed limits of clinical breast exam for detecting breast cancer.  - F/U 12 months and prn  2. Vaginal spotting - nothing evident for cause on exam by bimanual and speculum exam with close inspection of the vagina - reassurance given - If recurs, while unlikely, she  will call and we will reevaluate   Routine preventative health maintenance measures emphasized. Please refer to After Visit Summary for other counseling recommendations.   Milas Hock, MD, FACOG Obstetrician & Gynecologist, [facility], [facility]

## 2022-02-25 IMAGING — MG MM DIGITAL SCREENING BILAT W/ TOMO AND CAD
6 of 10 series · 6 of 30 positions shown · non-contrast
Comparison: Previous exam(s).

CLINICAL DATA: Screening.

EXAM:
DIGITAL SCREENING BILATERAL MAMMOGRAM WITH TOMOSYNTHESIS AND CAD
TECHNIQUE: Bilateral screening digital craniocaudal and mediolateral oblique
mammograms were obtained. Bilateral screening digital breast
tomosynthesis was performed. The images were evaluated with
computer-aided detection.

[R MLO synth-2D]
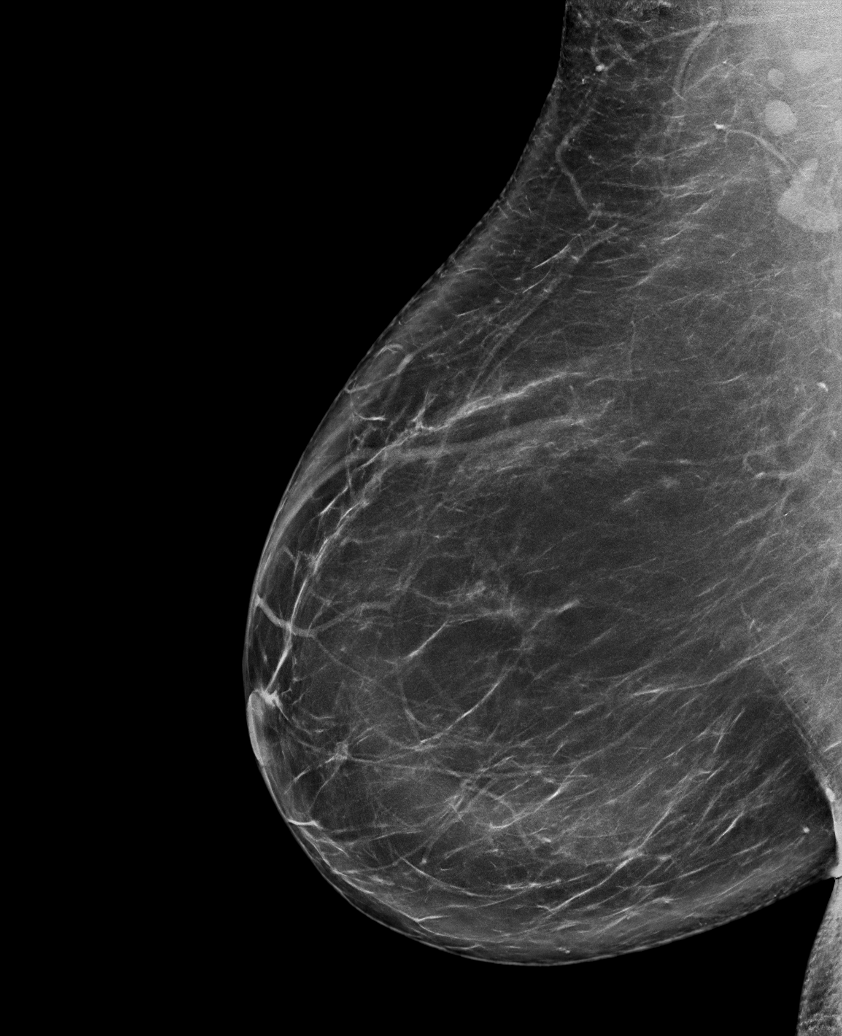

[R CC synth-2D]
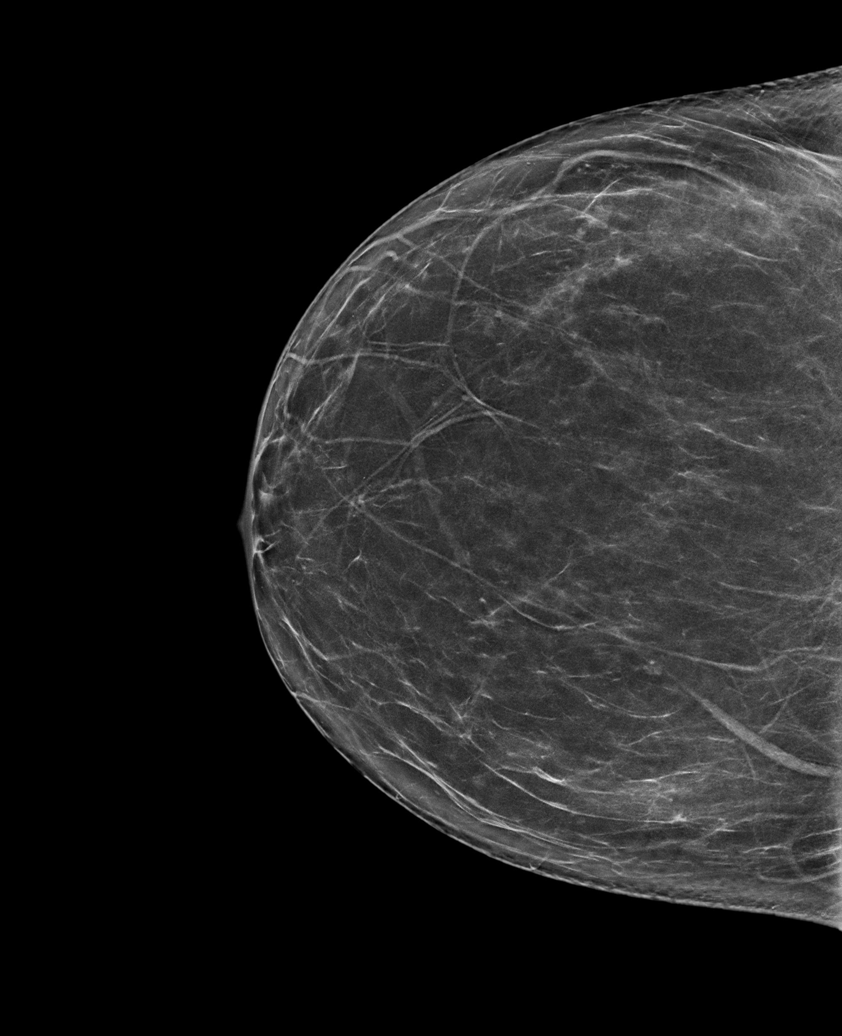

[L CC synth-2D (1 of 2)]
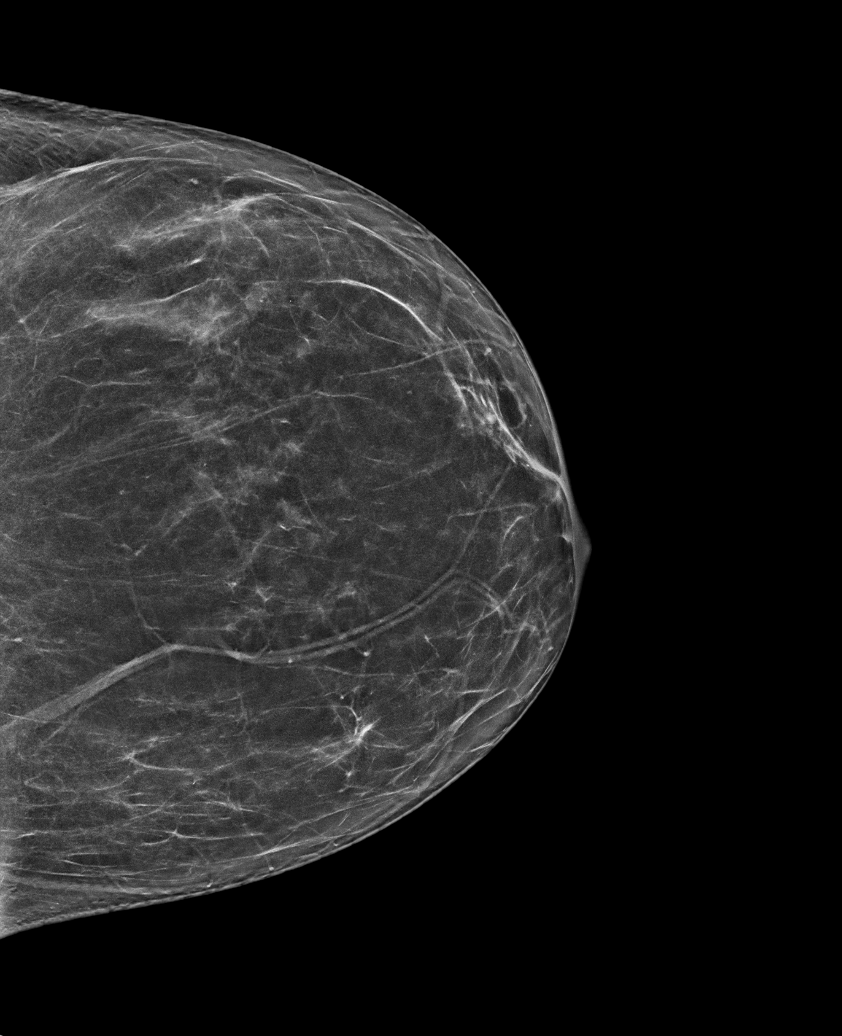

[L MLO synth-2D]
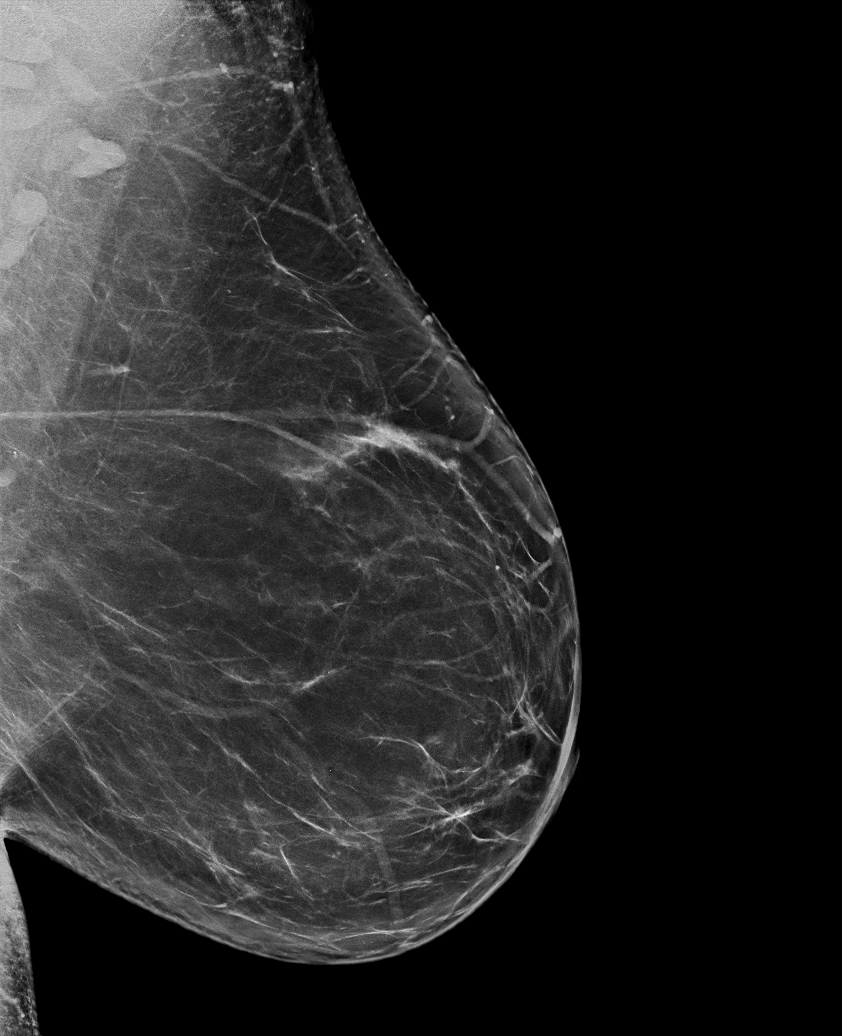

[L CC synth-2D (2 of 2)]
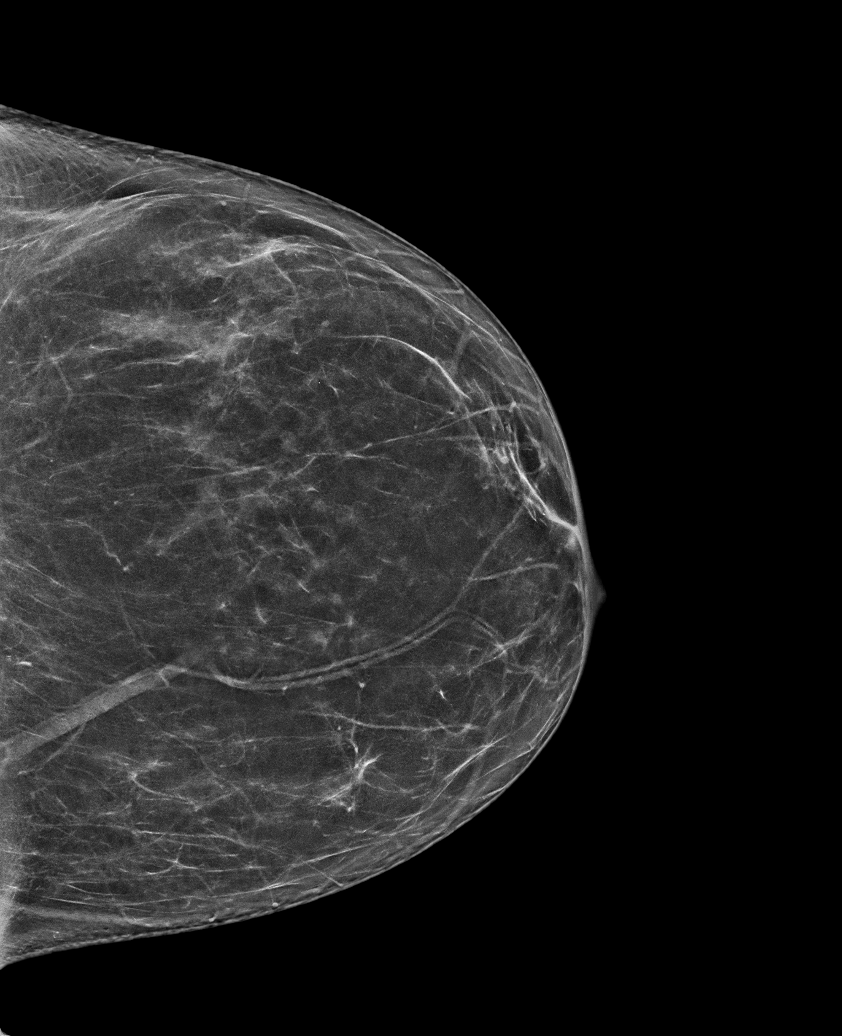

[L CC tomo · tomo slice 38/75.0]
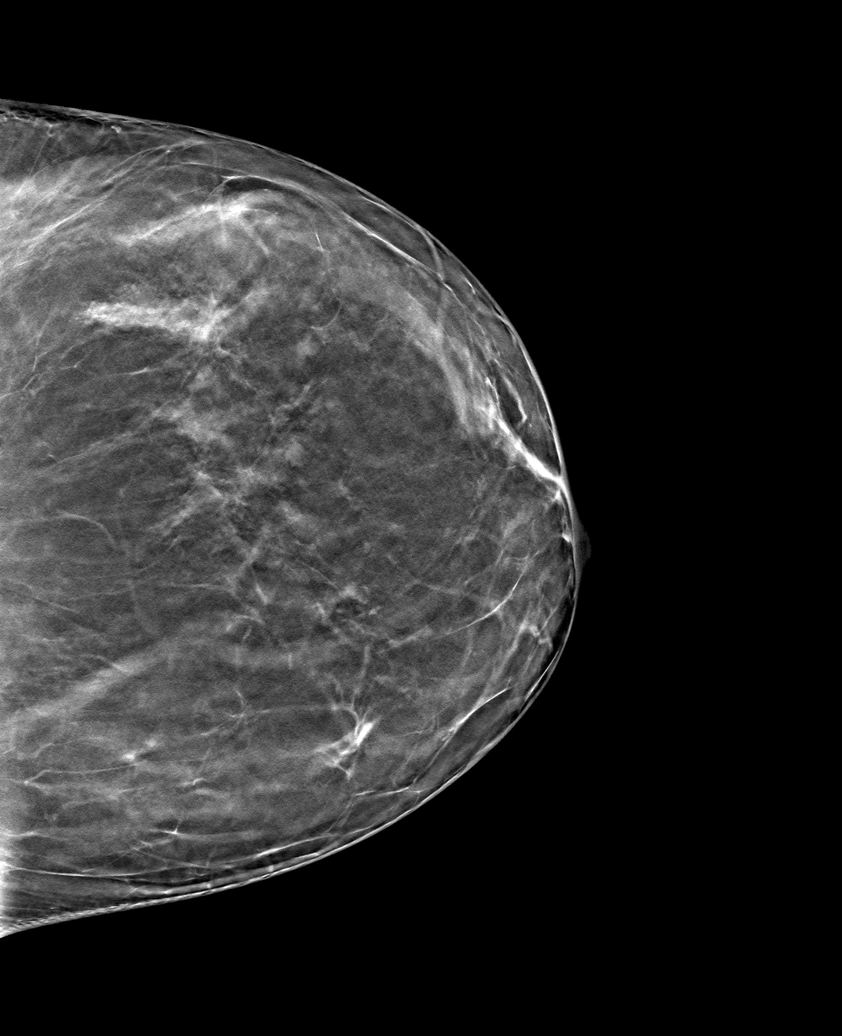

[6 of 30 positions shown; findings below may reference images not displayed]

ACR Breast Density Category b: There are scattered areas of
fibroglandular density.
FINDINGS: There are no findings suspicious for malignancy.
IMPRESSION: No mammographic evidence of malignancy. A result letter of this
screening mammogram will be mailed directly to the patient.

RECOMMENDATION:
Screening mammogram in one year. (Code:51-O-LD2)

BI-RADS CATEGORY  1: Negative.

## 2022-08-02 ENCOUNTER — Other Ambulatory Visit: Payer: Self-pay | Admitting: Obstetrics and Gynecology

## 2022-08-02 ENCOUNTER — Encounter: Payer: Self-pay | Admitting: Obstetrics and Gynecology

## 2022-08-03 ENCOUNTER — Other Ambulatory Visit: Payer: Self-pay | Admitting: Obstetrics and Gynecology

## 2022-08-03 ENCOUNTER — Other Ambulatory Visit: Payer: Self-pay | Admitting: *Deleted

## 2022-08-03 DIAGNOSIS — N644 Mastodynia: Secondary | ICD-10-CM

## 2022-08-03 NOTE — Progress Notes (Signed)
Order placed for diagnostic mammogram due to breast pain per Dr Para March.

## 2022-09-13 ENCOUNTER — Ambulatory Visit
Admission: RE | Admit: 2022-09-13 | Discharge: 2022-09-13 | Disposition: A | Discharge status: Home / Self Care | Payer: BC Managed Care – PPO | Source: Ambulatory Visit | Attending: Obstetrics and Gynecology | Admitting: Obstetrics and Gynecology

## 2022-09-13 DIAGNOSIS — N644 Mastodynia: Secondary | ICD-10-CM

## 2023-07-06 ENCOUNTER — Ambulatory Visit
Admission: RE | Admit: 2023-07-06 | Discharge: 2023-07-06 | Disposition: A | Discharge status: Home / Self Care | Payer: No Typology Code available for payment source | Source: Ambulatory Visit | Attending: Family Medicine | Admitting: Family Medicine

## 2023-07-06 DIAGNOSIS — Z87891 Personal history of nicotine dependence: Secondary | ICD-10-CM | POA: Diagnosis not present

## 2023-07-06 DIAGNOSIS — Z202 Contact with and (suspected) exposure to infections with a predominantly sexual mode of transmission: Secondary | ICD-10-CM | POA: Diagnosis not present

## 2023-07-06 NOTE — ED Provider Notes (Signed)
[facility]    CSN: [id number] Arrival date & time: 07/06/23  1152      History   Chief Complaint Chief Complaint  Patient presents with   Exposure to STD    Entered by patient    HPI Anne Dixon is a 47 y.o. female.   HPI  Patient was notified by her husband last night that he had sex with a woman who reports she is positive for chlamydia. We discussed STD testing that is appropriate by CDC standards  Past Medical History:  Diagnosis Date   Asthma     allergy related seasonal under control   GERD (gastroesophageal reflux disease)    with pregnancy   Seasonal allergies     Patient Active Problem List   Diagnosis Date Noted   Asthma 11/09/2013    Past Surgical History:  Procedure Laterality Date   CESAREAN SECTION     DIAGNOSTIC LAPAROSCOPY     Fallopian Tube Removal  03/22/2016   HYSTEROSCOPY N/A 01/24/2017   Procedure: DILATATION AND CURRETAGE, HYDROTHERMAL ABLATION REMOVAL OF IUD;  Surgeon: Lesly Dukes, MD;  Location: [facility];  Service: Gynecology;  Laterality: N/A;   LAPAROSCOPY  09/30/2018   Procedure: LAPAROSCOPY DIAGNOSTIC;  Surgeon: Allie Bossier, MD;  Location: [facility];  Service: Gynecology;;   VAGINAL HYSTERECTOMY N/A 09/30/2018   Procedure: HYSTERECTOMY VAGINAL;  Surgeon: Allie Bossier, MD;  Location: [facility];  Service: Gynecology;  Laterality: N/A;    OB History     Gravida  2   Para  2   Term  2   Preterm      AB      Living  2      SAB      IAB      Ectopic      Multiple      Live Births               Home Medications    Prior to Admission medications   Medication Sig Start Date End Date Taking? Authorizing Provider  albuterol (PROVENTIL HFA;VENTOLIN HFA) 108 (90 Base) MCG/ACT inhaler Inhale 2 puffs into the lungs every 6 (six) hours as needed for wheezing or shortness of breath.    [provider]  budesonide-formoterol (SYMBICORT) 80-4.5 MCG/ACT inhaler Inhale 2 puffs into the lungs 2  (two) times daily as needed (for shortness of breath).    [provider]  cetirizine (ZYRTEC) 10 MG tablet Take 10 mg by mouth at bedtime.     [provider]  meloxicam (MOBIC) 15 MG tablet Take 15 mg by mouth daily. 09/29/21   [provider]  montelukast (SINGULAIR) 10 MG tablet Take 10 mg by mouth at bedtime.  06/13/16   [provider]    Family History Family History  Problem Relation Age of Onset   Diabetes Paternal Grandfather    Diabetes Paternal Grandmother    Diabetes Maternal Grandmother    Diabetes Maternal Grandfather    Diabetes Father    Cervical cancer Sister     Social History Social History   Tobacco Use   Smoking status: Former    Current packs/day: 0.00    Average packs/day: 0.5 packs/day for 15.0 years (7.5 ttl pk-yrs)    Types: Cigarettes    Start date: 09/24/1994    Quit date: 09/24/2009    Years since quitting: 13.7   Smokeless tobacco: Never  Vaping Use   Vaping status: Never Used  Substance  Use Topics   Alcohol use: Yes    Comment: Socially    Drug use: Yes    Types: Marijuana    Comment: occassional,     Allergies   Sulfa antibiotics   Review of Systems Review of Systems  See HPI Physical Exam Triage Vital Signs ED Triage Vitals  Encounter Vitals Group     BP 07/06/23 1202 (!) (P) 141/93     Systolic BP Percentile --      Diastolic BP Percentile --      Pulse Rate 07/06/23 1202 (P) 70     Resp 07/06/23 1202 (P) 16     Temp 07/06/23 1202 (P) 98.4 F (36.9 C)     Temp src --      SpO2 --      Weight --      Height --      Head Circumference --      Peak Flow --      Pain Score 07/06/23 1201 0     Pain Loc --      Pain Education --      Exclude from Growth Chart --    No data found.  Updated Vital Signs BP (!) (P) 141/93   Pulse (P) 70   Temp (P) 98.4 F (36.9 C)   Resp (P) 16   LMP 07/21/2018      Physical Exam Constitutional:      General: She is not in acute distress.     Appearance: She is well-developed.  HENT:     Head: Normocephalic and atraumatic.  Eyes:     Conjunctiva/sclera: Conjunctivae normal.     Pupils: Pupils are equal, round, and reactive to light.  Cardiovascular:     Rate and Rhythm: Normal rate.  Pulmonary:     Effort: Pulmonary effort is normal. No respiratory distress.  Abdominal:     General: There is no distension.     Palpations: Abdomen is soft.  Musculoskeletal:        General: Normal range of motion.     Cervical back: Normal range of motion.  Skin:    General: Skin is warm and dry.  Neurological:     Mental Status: She is alert.      UC Treatments / Results  Labs (all labs ordered are listed, but only abnormal results are displayed) Labs Reviewed  RPR  HIV ANTIBODY (ROUTINE TESTING W REFLEX)  CERVICOVAGINAL ANCILLARY ONLY    EKG   Radiology No results found.  Procedures Procedures (including critical care time)  Medications Ordered in UC Medications - No data to display  Initial Impression / Assessment and Plan / UC Course  I have reviewed the triage vital signs and the nursing notes.  Pertinent labs & imaging results that were available during my care of the patient were reviewed by me and considered in my medical decision making (see chart for details).     Final Clinical Impressions(s) / UC Diagnoses   Final diagnoses:  Potential exposure to STD     Discharge Instructions      Check my chart for test results You will be called if any tests are positive    ED Prescriptions   None    PDMP not reviewed this encounter.   Eustace Moore, MD 07/06/23 1225

## 2023-07-06 NOTE — ED Triage Notes (Signed)
Pt presents to [facility] with need for sti check . Pt declines any symptoms. Pt reports husband had sex with another person who was positive for chlamydia.

## 2023-07-06 NOTE — Discharge Instructions (Signed)
Check my chart for test results You will be called if any tests are positive 

## 2023-07-08 LAB — CERVICOVAGINAL ANCILLARY ONLY
Bacterial Vaginitis (gardnerella): NEGATIVE
Candida Glabrata: NEGATIVE
Candida Vaginitis: NEGATIVE
Chlamydia: NEGATIVE
Comment: NEGATIVE
Comment: NEGATIVE
Comment: NEGATIVE
Comment: NEGATIVE
Comment: NEGATIVE
Comment: NORMAL
Neisseria Gonorrhea: NEGATIVE
Trichomonas: NEGATIVE

## 2023-07-09 LAB — RPR: RPR Ser Ql: NONREACTIVE

## 2023-07-09 LAB — HIV ANTIBODY (ROUTINE TESTING W REFLEX): HIV Screen 4th Generation wRfx: NONREACTIVE

## 2023-08-24 LAB — COLOGUARD: COLOGUARD: NEGATIVE

## 2023-08-24 LAB — EXTERNAL GENERIC LAB PROCEDURE: COLOGUARD: NEGATIVE

## 2023-10-21 ENCOUNTER — Other Ambulatory Visit: Payer: Self-pay | Admitting: Obstetrics and Gynecology

## 2023-10-21 DIAGNOSIS — Z1231 Encounter for screening mammogram for malignant neoplasm of breast: Secondary | ICD-10-CM

## 2023-10-30 ENCOUNTER — Ambulatory Visit: Payer: No Typology Code available for payment source

## 2023-10-30 DIAGNOSIS — Z1231 Encounter for screening mammogram for malignant neoplasm of breast: Secondary | ICD-10-CM | POA: Diagnosis not present

## 2024-08-11 NOTE — Progress Notes (Signed)
 Primary care provider: Werner Capri, PA Referring provider:     Werner Capri, PA  Assessment    1. Mild persistent asthma without complication (*)   2. Environmental and seasonal allergies   3. Class 2 severe obesity due to excess calories with serious comorbidity and body mass index (BMI) of 36.0 to 36.9 in adult (*)    She has asthma that is well controlled on current inhaler regimen with no new or worsening symptoms or flares in the interim. Spirometry today suggests restriction. She has significant environmental allergens, however she declines allergy immunotherapy as she did not tolerate the allergy drops and declines allergy shots. She will continue her current allergy regimen.   Plan   Asthma  Continue Symbicort 80, rinse mouth after each use; she uses this as needed Continue Albuterol as needed Spirometry today suggests restriction PFT at next visit Call if symptoms worsen  Follow up in 6 months or sooner if needed   Environmental and seasonal allergies Skin allergy testing 11/2017 showed 29/70 positive environmental allergens Skin allergy testing 06/30/2024 shows 46/70 positive environmental allergens She did not like allergy drops, declines all allergy immunotherapy at this time Continue Singulair Continue Zyrtec She did not tolerate Flonase in the past  Avoid allergens/triggers when possible   Obesity  BMI less than 18.5 is underweight  BMI 18.5 to 24.9 is normal or healthy weight  BMI 25.0 to 29.9 is overweight  BMI 30.0 or higher is obese Being obese means your BMI is 30 or higher. If your BMI is 40 or higher, you are considered severely or morbidly obese. Being obese may lead to many health problems. It may make it hard for you to breathe and move easily.   Obesity increases your risk for illnesses like diabetes, high blood pressure, high cholesterol, stroke, heart disease, and certain types of cancer. We recommend weight loss and daily  exercise The American Heart Association recommends at least 150 minutes of moderate intensity aerobic activity per week to reduce the risk of cardiovascular complications.  Moderately intense aerobic activities include brisk walking, tennis, gardening, biking, dancing  Consider a referral to the bariatric center for weight loss support    Total time spent in this encounter on date of service was 45 minutes and does not include additional procedure time. Including but not limited to activities such as reviewing patient records, obtaining or reviewing subjective medical history, obtaining or performing a history and physical examination, counseling and/or educating patient/family/caregiver, ordering prescription medication, tests, procedures, imaging, labs, referring to and communicating with other health care providers, documenting appropriate clinical information in the medical record electronic or other, interpreting results of prescribed tests/procedures, communicating those results to the patient/family/representative and coordinating patient care.    We recommend the COVID vaccine, yearly influenza vaccine and pneumonia vaccine if no contraindications per the current guidelines.   We discussed the diagnosis and treatment plan in detail and the patient expressed understanding.   Risks, benefits, and alternatives of the medications and treatment plan prescribed today were discussed, and patient expressed understanding. Plan follow-up as discussed or as needed if any worsening symptoms or change in condition.       Subjective   Chief Complaint  Patient presents with  . Asthma    Spirometry completed      Patient ID:  Anne Dixon is a 48 y.o. female former smoker (quit in 2010) with a history of asthma, allergies and obesity.   She presents for a follow up  regarding asthma and allergies. She started allergy drops 07/06/2024. She did not like the allergy drops so she stopped them. She  did not like the taste. She does not want to do allergy shots d/t cost. She is taking Singulair and Zyrtec. She uses Symbicort at least once a day. She is coughing up sputum now. A little bit of wheezing. No chest tightness. No shortness of breath. She uses the Xopenex 1-2 times a day for the last few weeks. No other concerns today.   Test Last Date Completed  PFT 12/11/2023  --  Overnight Pulse Ox --  PSG/HST --  PAP Titration --  Skin allergy test/RAST 06/30/2024   Review of Systems  Constitutional:  Negative for chills, fever and weight loss.  HENT:  Negative for congestion.   Respiratory:  Positive for cough and sputum production. Negative for hemoptysis, shortness of breath and wheezing.   Neurological:  Negative for headaches.  All other systems reviewed and are negative.   History   Past Medical History:  Diagnosis Date  . Allergy   . Asthma (*)    last used inhaler this week/USUALLY HAVE SINUS ISSUES  . Bronchitis   . Dental crown present   . Dental crown present    lower right back crown  . Diabetes mellitus (*) 2011   GESTATIONAL NON WITH THIS PREGNANCY  . Hx of gastroesophageal reflux (GERD)    Past Surgical History:  Procedure Laterality Date  . Cesarean section  08/14/2013  . Vaginal delivery     Social History[1] Family History  Problem Relation Age of Onset  . Cancer Father   . Stroke Father   . Alzheimer's disease Maternal Grandmother   . Diabetes Maternal Grandmother   . Dementia Maternal Grandmother   . Diabetes Paternal Grandmother   . Stroke Paternal Grandmother   . Diabetes Paternal Grandfather   . Cancer Paternal Grandfather     Medication History      Medication Sig Dispense Refill  . budesonide-formoterol (SYMBICORT) 80-4.5 mcg/actuation inhaler Inhale two puffs into the lungs 2 (two) times daily. 10.2 g 5  . cetirizine (ZYRTEC) 10 MG tablet Take one tablet (10 mg dose) by mouth daily.    . EPINEPHrine  (EPIPEN  2-PAK) 0.3 mg/0.3 mL  injection Inject 0.3 mLs (0.3 mg dose) into the muscle once as needed for up to 1 dose. 1 each 0  . ibuprofen  (ADVIL ,MOTRIN ) 200 mg tablet Take one tablet (200 mg dose) by mouth every 6 (six) hours as needed for Pain.    . levalbuterol (XOPENEX HFA) 45 MCG/ACT inhaler Inhale one puff to two puffs into the lungs every 4 (four) hours as needed for Wheezing. 15 g 3  . montelukast (SINGULAIR) 10 MG tablet TAKE 1 TABLET BY MOUTH EVERYDAY AT BEDTIME 90 tablet 1  . promethazine -dextromethorphan (PROMETHAZINE -DM) 6.25-15 MG/5ML syrup Take 5 mLs by mouth 3 (three) times a day as needed for Cough. 120 mL 0  . tirzepatide (ZEPBOUND) 2.5 mg/0.5 mL injection Inject 0.5 mLs (2.5 mg dose) into the skin once a week. Maximum dosage is 15 mg subcutaneously once weekly. Inject subcutaneously in the abdomen, thigh or upper arm. 2 mL 1  . tiZANidine (ZANAFLEX) 4 mg tablet Take one tablet (4 mg dose) by mouth every 8 (eight) hours as needed (For back spasm/stiffness/tightness and muscular discomfort.). 30 tablet 1  . valACYclovir (VALTREX) 500 mg tablet TAKE TWO TABLETS BY MOUTH 3 TIMES DAILY. Needs an appt for further refills 60 tablet 0  No current facility-administered medications for this visit.   Allergies[2]     I reviewed the patient's medical,surgical,social and family history. The medications and allergies have been reviewed and updated.   Objective  BP 132/64 (BP Location: Left Upper Arm, Patient Position: Sitting)   Pulse 94   Resp 16   Ht 5' 2 (1.575 m)   Wt 199 lb (90.3 kg)   LMP 04/09/2018 (Exact Date)   SpO2 96%   BMI 36.40 kg/m   General appearance:  alert, appears stated age, and cooperative Eyes:    pupils are equal, round and reactive Nose:     normal Mouth:    moist, no thrush Neck:    supple, no significant adenopathy, no JVD Chest:    clear to auscultation, no wheezes, rales or rhonchi, symmetric air entry Heart:    normal rate, regular rhythm, normal S1, S2, no murmurs, rubs,  clicks or gallops Abdomen:   obese Extremities:   no pitting edema, no clubbing   Pulmonary Function Testing / Spirometry  No results found. Radiology    CXR:  Results for orders placed during the hospital encounter of 02/05/20  XR Chest Pa And Lateral  Narrative CLINICAL HISTORY: Fever  COMPARISON: None  TECHNIQUE: PA and lateral views of the chest. 02/05/2020 10:37 AM  FINDINGS: The cardiomediastinal silhouette is within normal limits.  The lung fields are clear and the pleura is smooth.  Bones are intact.  Impression IMPRESSION: No acute cardiopulmonary abnormality.  Electronically Signed by: Grayce Linear    CT Scan: No results found for this or any previous visit.   No results found for this or any previous visit.   No results found for this or any previous visit.   No results found for this or any previous visit.   No results found for this or any previous visit.   No results found for this or any previous visit.   No results found for this or any previous visit.   No results found for this or any previous visit.   VQ Scan: No results found for this or any previous visit.   TTE:   No results found for this or any previous visit.  Labs   Lab Results  Component Value Date/Time   Glucose 89 02/27/2024 10:26 AM   BUN 7 02/27/2024 10:26 AM   Creatinine 0.71 02/27/2024 10:26 AM   eGFR 105 02/27/2024 10:26 AM   BUN/Creatinine Ratio 10 02/27/2024 10:26 AM   Sodium 143 02/27/2024 10:26 AM   Potassium 4.4 02/27/2024 10:26 AM   Chloride 103 02/27/2024 10:26 AM   CO2 25 02/27/2024 10:26 AM   Total Protein 7.3 02/27/2024 10:26 AM   Albumin, Serum 4.2 02/27/2024 10:26 AM   Globulin, Total 3.1 02/27/2024 10:26 AM   Albumin/Globulin Ratio 1.5 02/27/2023 10:00 AM   Total Bilirubin 0.3 02/27/2024 10:26 AM   Alkaline Phosphatase 122 (H) 02/27/2024 10:26 AM   AST 19 02/27/2024 10:26 AM   ALT (SGPT) 28 02/27/2024 10:26 AM    Lab Results   Component Value Date/Time   WBC 7.3 02/27/2024 10:14 AM   RBC 4.82 02/27/2024 10:14 AM   HGB 14.5 02/27/2024 10:14 AM   HCT 43.8 02/27/2024 10:14 AM   MCV 90.9 02/27/2024 10:14 AM   MCH 30.2 02/27/2024 10:14 AM   MCHC 33.2 02/27/2024 10:14 AM   RDW 12.5 10/07/2015 11:10 AM   Plt Ct 258 02/27/2024 10:14 AM   NEUTROPHIL % 60.4 02/27/2024 10:14 AM  LYMPHOCYTE % 31.8 02/27/2024 10:14 AM   Monocytes 5 10/07/2015 11:10 AM   Monocytes Absolute 0.6 10/07/2015 11:10 AM   Eosinophil % 0.6 (L) 02/05/2020 10:53 AM   Absolute Eosinophil Count 0.1 02/05/2020 10:53 AM   BASOPHIL % 0.2 02/05/2020 10:53 AM     Orders Placed This Encounter  Procedures  . RESPIRATORY FLOW VOLUME LOOP   Immunizations     Name Date Dose VIS Date Route   DTaP / TD / Tdap 06/29/2013 -- -- --   External: Patient reported   DTaP / TD / Tdap 06/29/2010 0.5 mL -- Intramuscular   Site: Right Deltoid   Manufacturer: [employer]   Lot: [id number]   Influenza 12/11/2023 -- -- --   Manufacturer: [employer]   Lot: [id number]   Influenza 09/06/2021 0.5 mL -- --   Lot: [id number]   Influenza 09/06/2021 0.5 mL -- --   Lot: [id number]   Influenza 08/26/2020 0.5 mL -- --   Lot: [id number]   Influenza 08/26/2020 0.5 mL -- --   Lot: [id number]   Influenza 11/03/2019 0.5 mL 07/17/2018 Intramuscular   Site: Right Deltoid   Given By: Elsie LELON Collet, CMA   Manufacturer: [employer]Smith[employer]   Lot: [id number]   Influenza 10/01/2018 0.5 mL -- --   Lot: [id number]   Influenza 09/21/2017 12:00 AM 0.5 mL -- --   Lot: [id number]   Influenza 09/21/2017 0.5 mL -- --   Lot: [id number]   Influenza 09/01/2016 12:00 AM .5 ML -- --   Lot: [id number]   Influenza 09/01/2016 0.5 mL -- --   Lot: [id number]   Influenza 09/29/2015 -- -- --   External: Patient reported   Influenza 09/10/2014 -- -- --   Influenza 11/09/2013 0.5 ml 07/21/2013 Intramuscular   Site: Right Deltoid   Given By: Elsie LELON Collet, CMA   Manufacturer: [employer]of[employer] P[employer]   Lot: [id number]   Pneumococcal Polysaccharide 11/15/2014 0.5  mL 10/07/2014 Intramuscular   Site: Right Arm   Given By: Elsie LELON Collet, CMA   Manufacturer: [employer]ck [employer]   Lot: [id number]   Pneumococcal Polysaccharide 11/15/2014 0.5 mL -- Intramuscular   Lot: [id number]   SARS-COV-2 (Covid-19) 10/18/2021 -- -- --   Manufacturer: [employer]   Lot: [id number]   SARS-COV-2 (Covid-19) 10/18/2020 11:39 AM 0.3 mL 11/13/2019 Intramuscular   Site: Right Deltoid   Given By: Ronal Morris Dolly, RN   Manufacturer: Pfizer   Lot: [id number] [id number]   SARS-COV-2 (Covid-19) 10/18/2020 0.3 mL -- Intramuscular   Lot: [id number]   SARS-COV-2 (Covid-19) 03/08/2020 -- -- --   Manufacturer: Pfizer[employer] Inc   Lot: [id number]   SARS-COV-2 (Covid-19) 02/16/2020 -- -- --   Manufacturer: Pfizer, Inc   Lot: [id number]         Patient's Medications       * Accurate as of August 11, 2024  1:44 PM. Reflects encounter med changes as of last refresh          Continued Medications      Instructions  budesonide-formoterol 80-4.5 mcg/actuation inhaler Commonly known as: SYMBICORT  2 puffs, Inhalation, 2 times a day   cetirizine 10 mg tablet Commonly known as: ZYRTEC  10 mg, Daily   EPINEPHrine  0.3 mg/0.3 mL injection Commonly known as: EPIPEN  2-PAK  0.3 mg, Intramuscular, Once as needed   ibuprofen  200 mg tablet Commonly known as: ADVIL ,MOTRIN   200 mg, Every 6 hours as needed   levalbuterol 45 MCG/ACT inhaler Commonly known as: XOPENEX HFA  1-2 puffs,  Inhalation, Every 4 hours as needed   montelukast 10 MG tablet Commonly known as: SINGULAIR  10 mg, Oral, At bedtime   promethazine -dextromethorphan 6.25-15 MG/5ML syrup Commonly known as: PROMETHAZINE -DM  5 mLs, Oral, 3 times a day as needed   tirzepatide 2.5 mg/0.5 mL injection Commonly known as: ZEPBOUND  2.5 mg, Subcutaneous, Weekly, Maximum dosage is 15 mg subcutaneously once weekly. Inject subcutaneously in the abdomen, thigh or upper arm.   tiZANidine 4 mg tablet Commonly known as: ZANAFLEX  4 mg, Oral, Every  8 hours as needed   valACYclovir 500 mg tablet Commonly known as: VALTREX  TAKE TWO TABLETS BY MOUTH 3 TIMES DAILY. Needs an appt for further refills        The patient was given a copy of the after visit summary.  Voice-recognition software was used in surveyor, minerals of this documentation. Unintended transcription errors may have escaped editorial review.   Patient was seen under the supervision of Dr. Javaid.         [1] Social History Socioeconomic History  . Marital status: Married  Occupational History    Employer: [employer]  Tobacco Use  . Smoking status: Former    Current packs/day: 0.00    Average packs/day: 1 pack/day for 16.7 years (16.7 ttl pk-yrs)    Types: Cigarettes    Start date: 12/03/1992    Quit date: 08/11/2009    Years since quitting: 15.0  . Smokeless tobacco: Never  Vaping Use  . Vaping status: Never Used  Substance and Sexual Activity  . Alcohol use: Yes    Alcohol/week: 6.0 standard drinks of alcohol    Types: 6 Drinks containing 0.5 oz of alcohol per week    Comment: rare  . Drug use: Yes    Types: Marijuana  . Sexual activity: Yes    Partners: Male    Birth control/protection: Surgical  [2] Allergies Allergen Reactions  . Sulfa Antibiotics Rash    Maculopapular eruption on arms and upper chest 04/25/2015  *Some images could not be shown.

## 2024-09-03 NOTE — Progress Notes (Signed)
  Impression and Plan   1. Mild persistent asthma without complication (*) (Primary) -doing well on current inhaler regimen 2. Severe obesity (BMI 35.0-39.9) with comorbidity (*) -well with weight loss and encouraged to be aggressive with consistent activity regimen, tolerating Zepbound well and bumping this up to 5 mg now -     tirzepatide (ZEPBOUND) 5 mg/0.5 mL injection; Inject 0.5 mLs (5 mg dose) into the skin once a week. Maximum dose is 15 mg subcutaneous weekly. Inject subcutaneously in the abdomen, thigh, or upper arm., Starting Thu 09/03/2024, Normal Other orders -     Flucelvax Trivalent Preservative Free Prefilled Syringe    Risks, benefits, and alternatives of the medications and treatment plan prescribed today were discussed, and patient expressed understanding. Plan follow-up as discussed or as needed if any worsening symptoms or change in condition.    A yearly preventative health exam was recommended and current age based recommendations were discussed.     Subjective      Patient presents with  . Mild persistent asthma without complication    History of Present Illness The patient is a 48 year old female with a past medical history significant for mild persistent asthma who presents for a routine follow-up.  She has been experiencing a cough for approximately 4 weeks, which she attributes to a recent cold.  States this has recently greatly improved and has had no recent symptoms.  Her insurance covers Zepbound until 03/2025, with a co-pay of $250. However, she is currently benefiting from a $150 discount on her co-pays through the end of the year. She is considering increasing her Zepbound dosage to 5 mg as she feels it is not adequately suppressing her appetite. Currently on a 2.5 mg dose of Zepbound, which she started on 07/20/2024, she has experienced a weight loss of 10 pounds. She reports no significant gastrointestinal side effects from the medication, although she does  experience some stomach upset and a feeling of fullness after eating. She is not diabetic and is making efforts to prevent the onset of diabetes. She is satisfied with her current progress and does not wish to increase her dosage to 12.5 mg.  Occupation: Sport and exercise psychologist    Reviewed and updated this visit by provider: Tobacco  Allergies  Meds  Problems  Med Hx  Surg Hx  Fam Hx       ROS:  Pertinent review of systems is negative except as noted.     Objective   Vitals:   09/03/24 1124  BP: 118/72  Patient Position: Sitting  Pulse: 89  Temp: 97.4 F (36.3 C)  TempSrc: Temporal  Height: 5' 2 (1.575 m)  Weight: 194 lb 6 oz (88.2 kg)  SpO2: 93%  BMI (Calculated): 35.5  PainSc: 0-No pain    General:  Well developed, Well nourished, overweight.  No distress HEENT: Normocephalic, atraumatic Neck:  Supple with normal range of motion; No lymphadenopathy  CV:  S1S2, Regular rate and rhythm, without murmur, gallops or rubs Lungs:  Clear to auscultation bilaterally with normal effort Skin:  No focal rashes  Extremities:  No clubbing, cyanosis or edema.   Neuro:  No focal deficits

## 2024-11-10 ENCOUNTER — Other Ambulatory Visit: Payer: Self-pay | Admitting: Physician Assistant

## 2024-11-10 DIAGNOSIS — Z1231 Encounter for screening mammogram for malignant neoplasm of breast: Secondary | ICD-10-CM

## 2024-11-16 ENCOUNTER — Inpatient Hospital Stay (HOSPITAL_BASED_OUTPATIENT_CLINIC_OR_DEPARTMENT_OTHER)
Admission: RE | Admit: 2024-11-16 | Discharge: 2024-11-16 | Attending: Physician Assistant | Admitting: Physician Assistant

## 2024-11-16 ENCOUNTER — Encounter (HOSPITAL_BASED_OUTPATIENT_CLINIC_OR_DEPARTMENT_OTHER): Payer: Self-pay

## 2024-11-16 DIAGNOSIS — Z1231 Encounter for screening mammogram for malignant neoplasm of breast: Secondary | ICD-10-CM | POA: Diagnosis present

## 2024-11-20 ENCOUNTER — Other Ambulatory Visit: Payer: Self-pay | Admitting: Physician Assistant

## 2024-11-20 DIAGNOSIS — R928 Other abnormal and inconclusive findings on diagnostic imaging of breast: Secondary | ICD-10-CM

## 2024-12-09 ENCOUNTER — Ambulatory Visit
Admission: RE | Admit: 2024-12-09 | Discharge: 2024-12-09 | Disposition: A | Discharge status: Home / Self Care | Source: Ambulatory Visit | Attending: Physician Assistant | Admitting: Physician Assistant

## 2024-12-09 DIAGNOSIS — R928 Other abnormal and inconclusive findings on diagnostic imaging of breast: Secondary | ICD-10-CM
